# Patient Record
Sex: Female | Born: 1980 | Race: Black or African American | Hispanic: No | Marital: Married | Smoking: Never smoker
Health system: Southern US, Community
[De-identification: ages and names within clinical notes are randomized; demographics above are authoritative.]

## PROBLEM LIST (undated history)

## (undated) DIAGNOSIS — Z789 Other specified health status: Secondary | ICD-10-CM

## (undated) HISTORY — DX: Other specified health status: Z78.9

## (undated) HISTORY — PX: MYOMECTOMY: SHX85

---

## 2001-09-29 ENCOUNTER — Emergency Department (HOSPITAL_COMMUNITY): Admission: EM | Admit: 2001-09-29 | Discharge: 2001-09-29 | Payer: Self-pay | Admitting: Emergency Medicine

## 2002-01-17 ENCOUNTER — Emergency Department (HOSPITAL_COMMUNITY): Admission: EM | Admit: 2002-01-17 | Discharge: 2002-01-17 | Payer: Self-pay | Admitting: Emergency Medicine

## 2004-09-25 ENCOUNTER — Ambulatory Visit (HOSPITAL_COMMUNITY): Admission: RE | Admit: 2004-09-25 | Discharge: 2004-09-25 | Payer: Self-pay | Admitting: Obstetrics and Gynecology

## 2005-02-22 ENCOUNTER — Inpatient Hospital Stay (HOSPITAL_COMMUNITY): Admission: RE | Admit: 2005-02-22 | Discharge: 2005-02-24 | Payer: Self-pay | Admitting: Obstetrics and Gynecology

## 2005-02-22 ENCOUNTER — Encounter: Payer: Self-pay | Admitting: Obstetrics and Gynecology

## 2005-03-02 ENCOUNTER — Emergency Department (HOSPITAL_COMMUNITY): Admission: EM | Admit: 2005-03-02 | Discharge: 2005-03-03 | Payer: Self-pay | Admitting: *Deleted

## 2005-05-23 ENCOUNTER — Ambulatory Visit (HOSPITAL_COMMUNITY): Admission: RE | Admit: 2005-05-23 | Discharge: 2005-05-23 | Payer: Self-pay | Admitting: Obstetrics and Gynecology

## 2007-01-07 IMAGING — RF DG HYSTEROGRAM
6 series · 10 of 10 positions shown · IV contrast (omnipaque)
Comparison: none

CLINICAL DATA: Infertility in patient status post myomectomy.
 HYSTEROSALPINGOGRAM:
TECHNIQUE: Following cleansing of the cervix and vagina with Betadine, a hysterosalpingogram catheter was placed into the endocervical canal.  Hysterosalpingogram was then performed using Omnipaque 300 contrast.

[Series 1: run · 3 of 3 slices shown (1 of 6)]
[im 1/3]
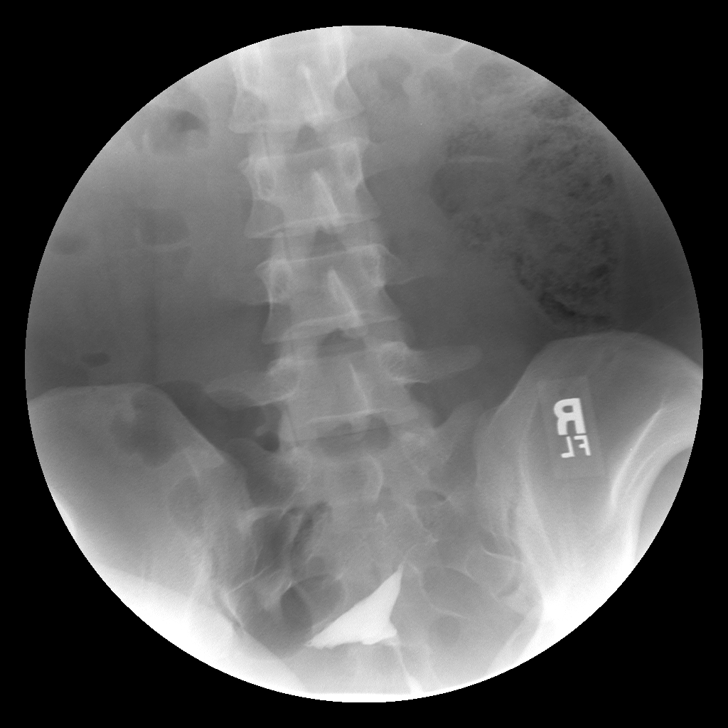
[im 2/3]
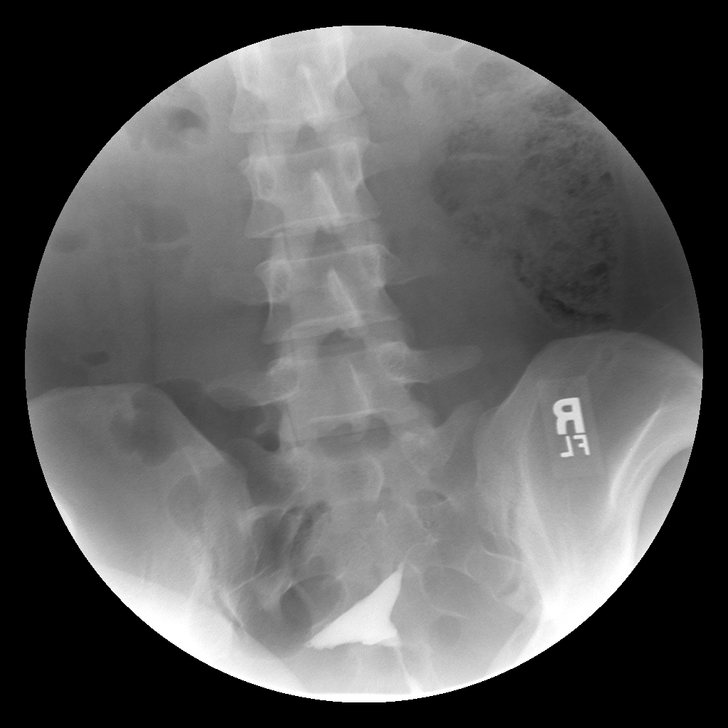
[im 3/3]
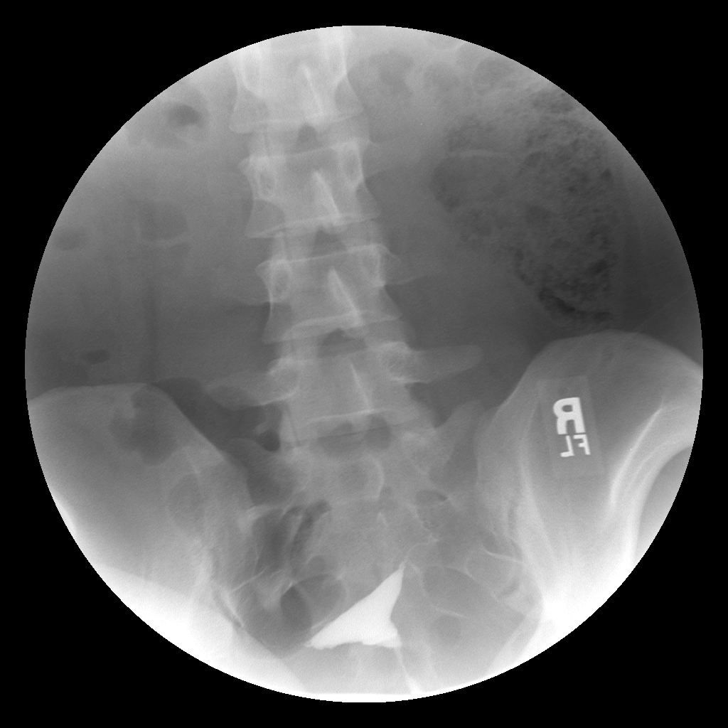

[Series 2: run · 2 of 2 slices shown (2 of 6)]
[im 1/2]
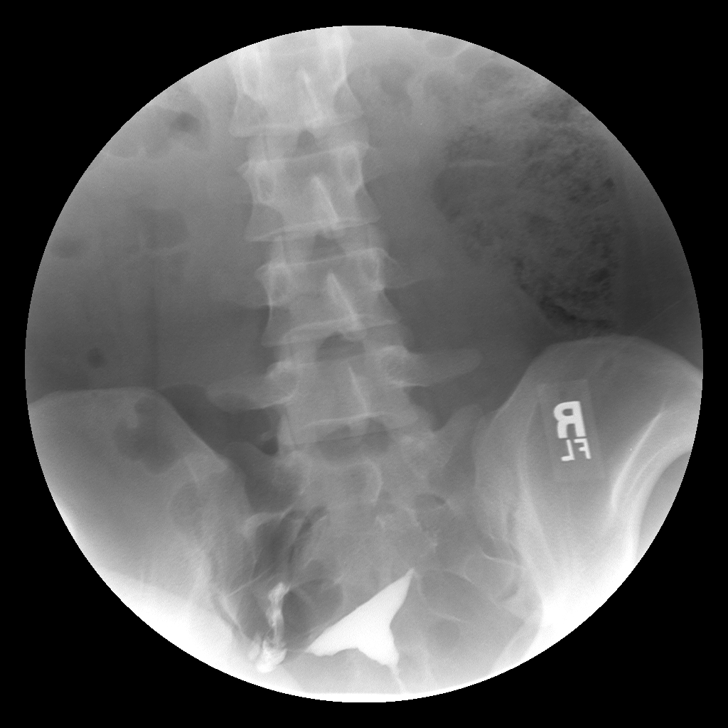
[im 2/2]
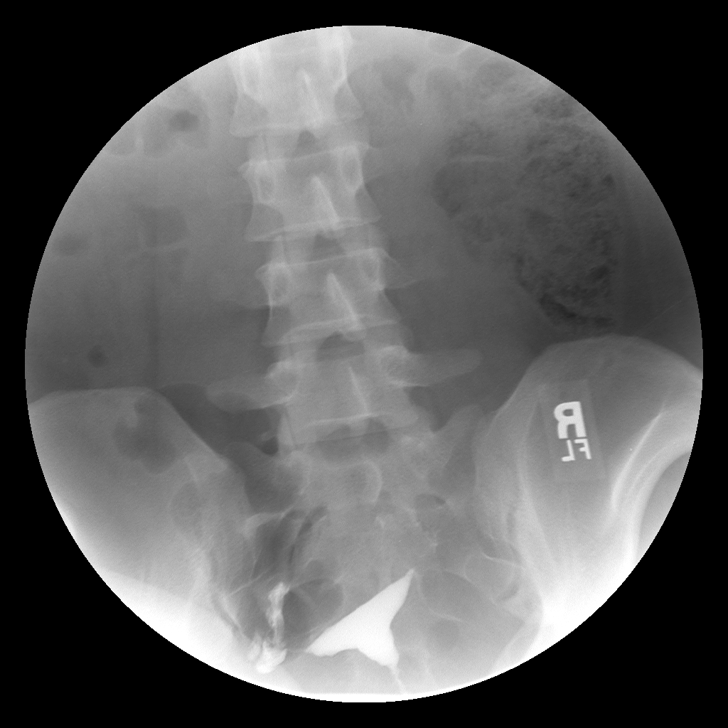

[Series 3: run · 2 of 2 slices shown (3 of 6)]
[im 1/2]
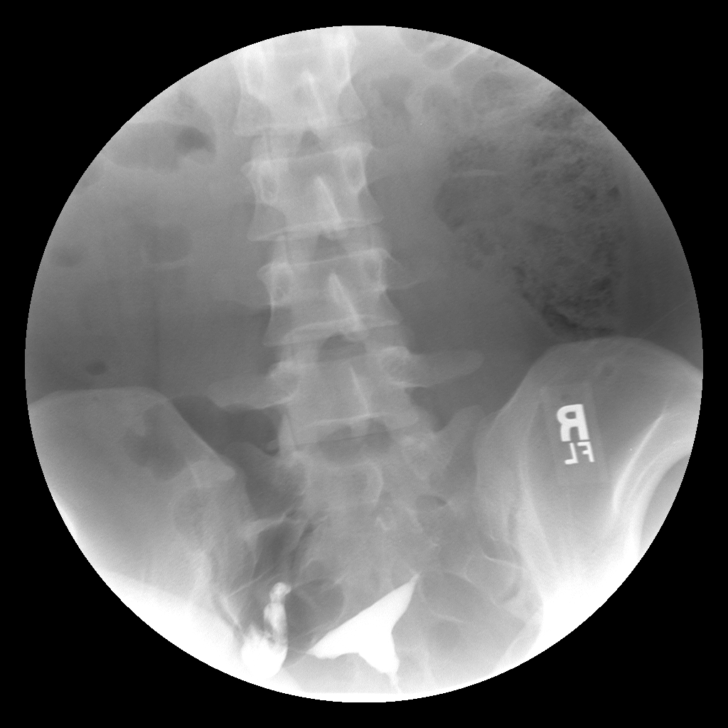
[im 2/2]
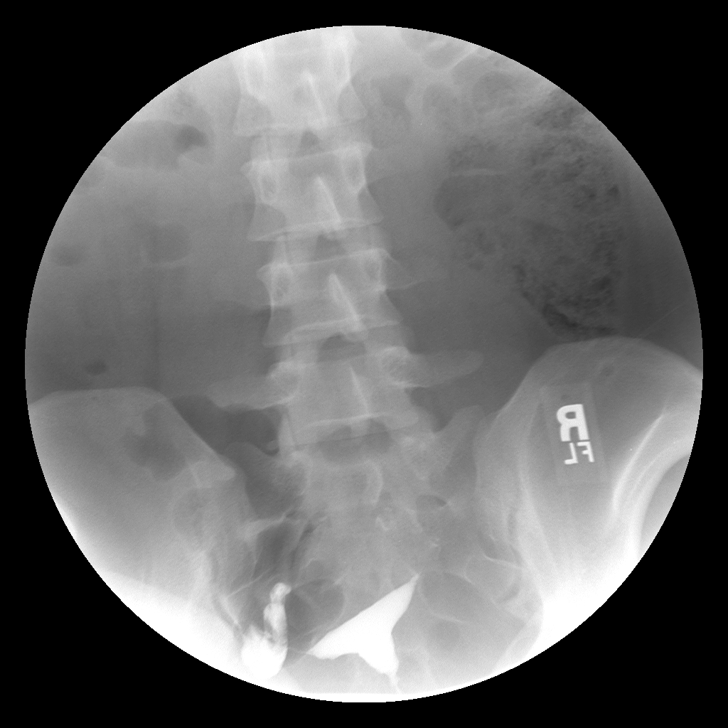

[Series 4: run · 1 of 1 slices shown (4 of 6)]
[im 1/1]
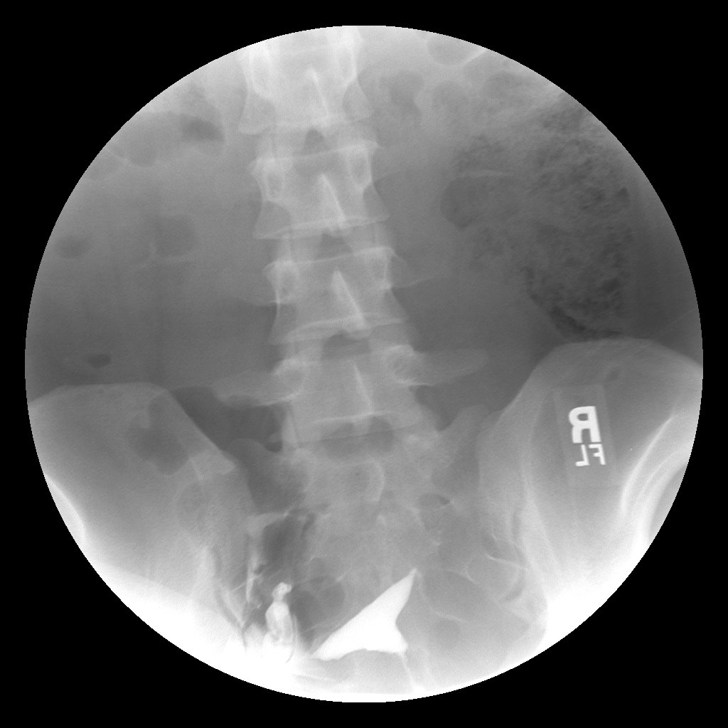

[Series 5: run · 1 of 1 slices shown (5 of 6)]
[im 1/1]
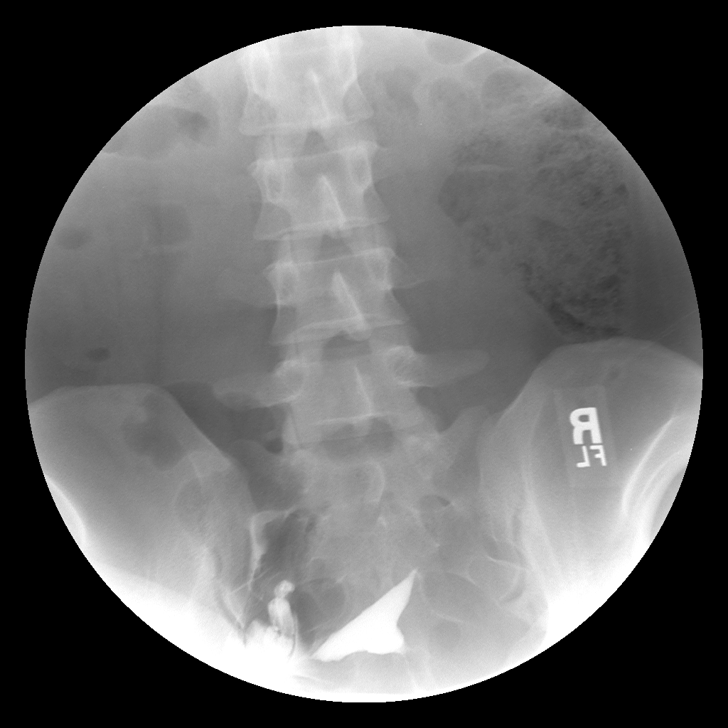

[Series 6: run · 1 of 1 slices shown (6 of 6)]
[im 1/1]
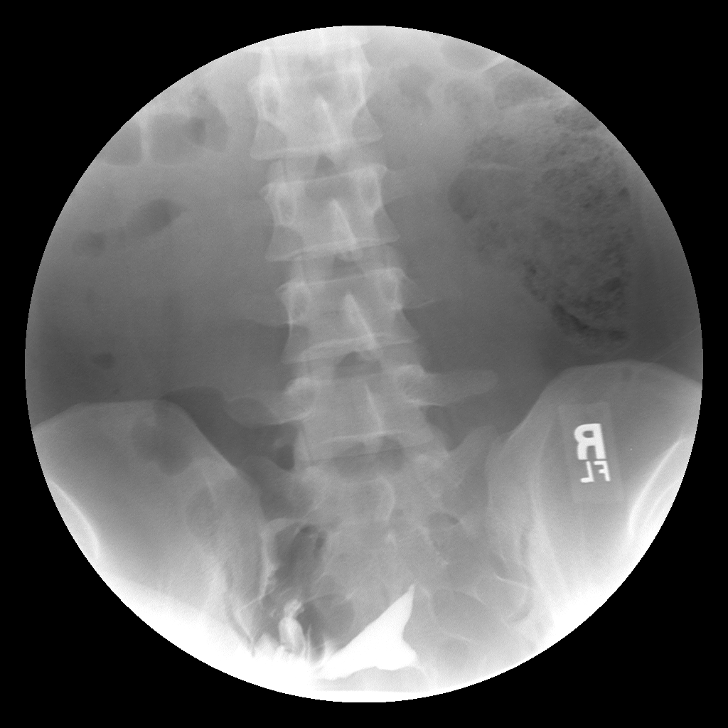

[10 of 10 positions shown; findings below may reference images not displayed]

FINDINGS: The uterus has a normal configuration.  There is prompt spill of contrast material out of the left Fallopian tube.  The right tube fills with contrast and measures approximately 1.5 cm.  No spill of contrast is identified from the right tube.  Note is made that the cervical canal appears somewhat narrow.
IMPRESSION: 1.  Normal appearing left Fallopian tube with spill of contrast material into the peritoneal cavity.
 2.  Right Fallopian tube fills with contrast but there is no spill of contrast material into the peritoneal cavity.

## 2007-07-11 ENCOUNTER — Ambulatory Visit: Payer: Self-pay | Admitting: Family Medicine

## 2007-07-11 DIAGNOSIS — O3410 Maternal care for benign tumor of corpus uteri, unspecified trimester: Secondary | ICD-10-CM

## 2007-07-11 DIAGNOSIS — D259 Leiomyoma of uterus, unspecified: Secondary | ICD-10-CM | POA: Insufficient documentation

## 2007-07-11 DIAGNOSIS — E663 Overweight: Secondary | ICD-10-CM | POA: Insufficient documentation

## 2007-07-11 DIAGNOSIS — R42 Dizziness and giddiness: Secondary | ICD-10-CM | POA: Insufficient documentation

## 2007-07-14 ENCOUNTER — Telehealth (INDEPENDENT_AMBULATORY_CARE_PROVIDER_SITE_OTHER): Payer: Self-pay | Admitting: *Deleted

## 2007-07-14 LAB — CONVERTED CEMR LAB
Basophils Absolute: 0 10*3/uL (ref 0.0–0.1)
CO2: 20 meq/L (ref 19–32)
Calcium: 9.1 mg/dL (ref 8.4–10.5)
Chloride: 105 meq/L (ref 96–112)
Creatinine, Ser: 0.65 mg/dL (ref 0.40–1.20)
Glucose, Bld: 90 mg/dL (ref 70–99)
Hemoglobin: 13.1 g/dL (ref 12.0–15.0)
MCHC: 34.8 g/dL (ref 30.0–36.0)
MCV: 89.3 fL (ref 78.0–100.0)
RBC: 4.21 M/uL (ref 3.87–5.11)
Sodium: 138 meq/L (ref 135–145)
TSH: 1.183 microintl units/mL (ref 0.350–5.50)
Total Bilirubin: 0.3 mg/dL (ref 0.3–1.2)
WBC: 5.3 10*3/uL (ref 4.0–10.5)

## 2007-07-15 ENCOUNTER — Encounter (INDEPENDENT_AMBULATORY_CARE_PROVIDER_SITE_OTHER): Payer: Self-pay | Admitting: Family Medicine

## 2007-07-25 ENCOUNTER — Ambulatory Visit: Payer: Self-pay | Admitting: Family Medicine

## 2007-07-25 LAB — CONVERTED CEMR LAB
Bilirubin Urine: NEGATIVE
Blood in Urine, dipstick: NEGATIVE
Glucose, Urine, Semiquant: NEGATIVE
Protein, U semiquant: NEGATIVE
Specific Gravity, Urine: 1.02
Urobilinogen, UA: 0.2
WBC Urine, dipstick: NEGATIVE

## 2007-07-26 ENCOUNTER — Encounter (INDEPENDENT_AMBULATORY_CARE_PROVIDER_SITE_OTHER): Payer: Self-pay | Admitting: Family Medicine

## 2007-07-27 LAB — CONVERTED CEMR LAB
Cholesterol: 144 mg/dL (ref 0–200)
VLDL: 10 mg/dL (ref 0–40)

## 2007-07-28 ENCOUNTER — Telehealth (INDEPENDENT_AMBULATORY_CARE_PROVIDER_SITE_OTHER): Payer: Self-pay | Admitting: *Deleted

## 2007-07-29 ENCOUNTER — Encounter (INDEPENDENT_AMBULATORY_CARE_PROVIDER_SITE_OTHER): Payer: Self-pay | Admitting: Family Medicine

## 2007-09-11 ENCOUNTER — Ambulatory Visit: Payer: Self-pay | Admitting: Family Medicine

## 2007-09-11 DIAGNOSIS — R51 Headache: Secondary | ICD-10-CM | POA: Insufficient documentation

## 2007-09-11 DIAGNOSIS — R519 Headache, unspecified: Secondary | ICD-10-CM | POA: Insufficient documentation

## 2008-03-09 LAB — CONVERTED CEMR LAB

## 2008-03-17 ENCOUNTER — Other Ambulatory Visit: Admission: RE | Admit: 2008-03-17 | Discharge: 2008-03-17 | Payer: Self-pay | Admitting: Obstetrics and Gynecology

## 2008-03-26 ENCOUNTER — Ambulatory Visit (HOSPITAL_COMMUNITY): Admission: RE | Admit: 2008-03-26 | Discharge: 2008-03-26 | Payer: Self-pay | Admitting: Obstetrics and Gynecology

## 2008-09-22 ENCOUNTER — Ambulatory Visit: Payer: Self-pay | Admitting: Family Medicine

## 2008-09-22 DIAGNOSIS — R5383 Other fatigue: Secondary | ICD-10-CM

## 2008-09-22 DIAGNOSIS — F4321 Adjustment disorder with depressed mood: Secondary | ICD-10-CM | POA: Insufficient documentation

## 2008-09-22 DIAGNOSIS — R5381 Other malaise: Secondary | ICD-10-CM | POA: Insufficient documentation

## 2008-09-23 ENCOUNTER — Encounter (INDEPENDENT_AMBULATORY_CARE_PROVIDER_SITE_OTHER): Payer: Self-pay | Admitting: Family Medicine

## 2008-09-24 ENCOUNTER — Encounter (INDEPENDENT_AMBULATORY_CARE_PROVIDER_SITE_OTHER): Payer: Self-pay | Admitting: *Deleted

## 2008-09-24 LAB — CONVERTED CEMR LAB
Basophils Absolute: 0 10*3/uL (ref 0.0–0.1)
Eosinophils Relative: 0 % (ref 0–5)
HCT: 38.5 % (ref 36.0–46.0)
Lymphocytes Relative: 39 % (ref 12–46)
MCV: 89.5 fL (ref 78.0–100.0)
Monocytes Absolute: 0.4 10*3/uL (ref 0.1–1.0)
Neutrophils Relative %: 51 % (ref 43–77)
Platelets: 249 10*3/uL (ref 150–400)
RBC: 4.3 M/uL (ref 3.87–5.11)

## 2008-10-14 ENCOUNTER — Ambulatory Visit (HOSPITAL_COMMUNITY): Payer: Self-pay | Admitting: Psychiatry

## 2008-10-21 ENCOUNTER — Ambulatory Visit (HOSPITAL_COMMUNITY): Payer: Self-pay | Admitting: Psychiatry

## 2008-11-26 ENCOUNTER — Emergency Department (HOSPITAL_BASED_OUTPATIENT_CLINIC_OR_DEPARTMENT_OTHER): Admission: EM | Admit: 2008-11-26 | Discharge: 2008-11-26 | Payer: Self-pay | Admitting: Emergency Medicine

## 2009-09-16 ENCOUNTER — Other Ambulatory Visit: Admission: RE | Admit: 2009-09-16 | Discharge: 2009-09-16 | Payer: Self-pay | Admitting: Obstetrics and Gynecology

## 2010-07-15 LAB — PREGNANCY, URINE: Preg Test, Ur: NEGATIVE

## 2010-07-15 LAB — GLUCOSE, CAPILLARY: Glucose-Capillary: 104 mg/dL — ABNORMAL HIGH (ref 70–99)

## 2010-08-25 NOTE — H&P (Signed)
Julie Robbins, Julie NO.:  [id number]   MEDICAL RECORD NO.:  [id number]          PATIENT TYPE:  AMB   LOCATION:  DAY                           FACILITY:  [facility]   PHYSICIAN:  Tilda Burrow, M.D. DATE OF BIRTH:  15-Aug-1980   DATE OF ADMISSION:  02/22/2005  DATE OF DISCHARGE:  LH                                HISTORY & PHYSICAL   ADMISSION DIAGNOSES:  1.  Uterine fibroids, 20-week size.  2.  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  3.  Status post Lupron suppression of menses.   HISTORY OF PRESENT ILLNESS:  This 30 year old female, gravida 0, para 0, now  3 months status post taking Lupron injection is admitted at this time for  multiple myomectomy.  Julie Robbins has been seen in our office over the past few  month, had ultrasound performed at [facility] showing large fibroid  deformity of the uterus with fibroids up to 13 cm in diameter, 2 additional  masses measuring 6 x 6 cm and 6 x 9 x 5.7 cm.  The endometrial stripe could  be followed through the uterine mass.  She was then placed on Lupron  suppression of the endometrium.  She is now at this time scheduled for  multiple myomectomy.  The patient is aware that the potential for bleeding  or technical complications could result in tubal obstruction after the  procedure.  Hysterectomy would only be considered if massive bleeding were  encountered.  She has been given the option of referral to Dr. Elesa Hacker for  surgery, and she has declines this option.   PAST MEDICAL HISTORY:  Essentially benign.   PAST SURGICAL HISTORY:  Negative.   INJURIES:  None.   PHYSICAL EXAMINATION:  VITAL SIGNS:  Height 5 feet 4 inches, weight 150s.  Blood pressure 124/70.  GENERAL:  Exam shows a relatively slim, intelligent, active, well-spoken  African-American female, alert and oriented x3.  HEENT:  Pupils equal, round, and reactive to light.  Extraocular movements  intact.  NECK:  Supple.  CHEST:  Clear to  auscultation.  ABDOMEN:  Hard, firm mass to the umbilicus due to the fibroid enlargement of  the uterus.  Midline vertical incision will be required.  PELVIC:  Cervix is deviated very posteriorly and to the right by large left-  sided fibroid.  The uterus is impacted into  the symphysis pubis.  EXTREMITIES:  Grossly normal without cyanosis, clubbing, or edema.   PLAN:  Multiple myomectomies on February 22, 2005.      Tilda Burrow, M.D.  Electronically Signed     JVF/MEDQ  D:  02/18/2005  T:  02/18/2005  Job:  [id number]

## 2010-08-25 NOTE — Op Note (Signed)
[name], [name] NO.:  [id number]   MEDICAL RECORD NO.:  [id number]          PATIENT TYPE:  INP   LOCATION:  A418                          FACILITY:  [facility]   PHYSICIAN:  Tilda Burrow, M.D. DATE OF BIRTH:  Apr 15, 1980   DATE OF PROCEDURE:  DATE OF DISCHARGE:  02/24/2005                                 OPERATIVE REPORT   l   PREOPERATIVE DIAGNOSIS:  Multiple uterine fibroids.   POSTOPERATIVE DIAGNOSIS:  Multiple uterine fibroids.  Same.   PROCEDURE:  Multiple myomectomies.   SURGEON:  Tilda Burrow, M.D.   ASSISTANT:  1.  Kendrick, CST  2.  Amie Critchley, CST   ANESTHESIA:  Reuel Boom, CRNA   COMPLICATIONS:  None.   FINDINGS:  Huge myomas, three of which were excised.  A single, small 1 cm  myoma was left in place on the posterior uterine fundus She had showed  myomas, C3, for three of which were excised to a single small 1 cm myoma was  left in place on the posterior uterine fundus, subserosal felt not to be  clinically significant.   DESCRIPTION OF PROCEDURE:  The patient was taken to the operating room,  prepped and draped in usual fashion for lower abdominal surgery.  A midline  vertical incision was performed from the umbilicus to the mons pubis with  easy entry into the peritoneal cavity and through the midline, packing the  bowel away.  A Balfour retractor was not necessary.  The uterus flipped  anteriorly as documented in photo #1.  This showed the large myomas which  photo #2 shows from the foot of the table a transverse incision over the  uterus exposing a large myoma from the anterior lower uterine segment.  This  was measured at 10 x 6.5 cm in largest diameter once it was excised.  The  specimen was shelled out with a combination of sharp and blunt dissection  and was pulled out without any difficulty.  We did not see any evidence that  we entered the uterine cavity though the myometrium was quite thick  overlying the myoma. As we were  unable to perform preprocedure staining of  the endometrial cavity with methylene blue, due to the difficult access to  the cervix due to the deformity of the uterus caused fibroids.  The large  cavity was pulled together with a series of 2-0 Dexon interrupted sutures  with careful subserosal closure of the initial incision, photo #3.  Subsequently, we inspected the posterior aspects of the uterus as in photo  #4 where there was a large 5 cm fibroid posterior to the tube and ovary on  the right. There was a smaller one that rested underneath the left tube.  We  then took out the right posterior fibroid by making a vertical incision over  the myoma in such a way as to operate at least 2 cm from the tube and ovary  complex.  We were quite some distance from the fimbriae on the right side.  This myoma was shelled out very nicely as well with minimal cautery  necessary in the bed  for hemostasis.  We were well away from the tube and  ovary and no suspected occlusion of the tube was felt to be likely.  The  defect could be closed nicely.  Photos 5 and 6 showed preparation for this  right posterior myomectomy.  Subsequently the third and final myoma was  removed from beneath the left fallopian tube. This was accessed throughout  again. A vertical incision performed just anterior to the round ligament on  the left side. This was shelled out nicely as well and all three of them  closed easily without any you visible suggestion that we might be kinking or  compromising the tubes in any way.  Closure was with several layers in each  case but resulted in subcuticular closure with minimal asymmetry of the  incision edges. The uterus was irrigated and confirmed as hemostatic.  It  was reintroduced into the abdominal cavity, then irrigated once again  confirmed as hemostatic.  Anterior peritoneum closed with 2-0 chromic  followed by running 0 Prolene closure in the and subcuticular 2-0 plain  interrupted  closure of the subcu fatty tissue with a staple closure of the  skin  EBL was minimal.  Urine output was 500 cc.      Tilda Burrow, M.D.  Electronically Signed     JVF/MEDQ  D:  03/12/2005  T:  03/13/2005  Job:  [id number]

## 2010-08-25 NOTE — Discharge Summary (Signed)
NAMEKERRIE, Robbins NO.:  [id number]   MEDICAL RECORD NO.:  [id number]          PATIENT TYPE:  INP   LOCATION:  A418                          FACILITY:  [facility]   PHYSICIAN:  Tilda Burrow, M.D. DATE OF BIRTH:  [birth year]   DATE OF ADMISSION:  02/22/2005  DATE OF DISCHARGE:  11/18/2006LH                                 DISCHARGE SUMMARY   ADMISSION DIAGNOSIS:  Multiple uterine fibroids with primary infertility.   DISCHARGE DIAGNOSIS:  Multiple uterine fibroids with primary infertility.   PROCEDURE:  Multiple myomectomy x3 by Tilda Burrow, M.D.   DISCHARGE MEDICATIONS:  1.  Vicodin one to two q.4h. p.r.n. pain.  2.  Phenergan 25 mg one p.o. q.6h. p.r.n. nausea.  3.  Doxycycline 100 mg b.i.d. x7 days.   FOLLOW UP:  Follow up in 1 week in our office.   HOSPITAL COURSE:  This 30 year old, nulliparous female with huge fibroids  almost to the umbilicus was admitted for multiple myomectomy as described in  the admitting history.  A midline vertical incision was used to access the  20-week size uterus which had three fibroids removed with a total weight of  564 g with fibroids measuring from 294 g to almost 300 g.  The patient had  benign leiomyomas diagnosed on pathology.   Postoperative course was uneventful.  The patient had 100 mL blood loss.  She did not require blood.  Blood type is O positive.  Postop hemoglobin  10.9, hematocrit 30.7 compared to 11.3 and 32.9 on preoperative assessment.   CONDITION ON DISCHARGE:  Stable.   DISPOSITION:  Discharged with routine post surgical instructions on February 24, 2005.      Tilda Burrow, M.D.  Electronically Signed     JVF/MEDQ  D:  03/12/2005  T:  03/12/2005  Job:  [id number]

## 2010-12-25 ENCOUNTER — Other Ambulatory Visit: Payer: Self-pay | Admitting: Obstetrics and Gynecology

## 2010-12-25 DIAGNOSIS — D259 Leiomyoma of uterus, unspecified: Secondary | ICD-10-CM

## 2011-01-03 ENCOUNTER — Ambulatory Visit (HOSPITAL_COMMUNITY)
Admission: RE | Admit: 2011-01-03 | Discharge: 2011-01-03 | Disposition: A | Discharge status: Home / Self Care | Payer: 59 | Source: Ambulatory Visit | Attending: Obstetrics and Gynecology | Admitting: Obstetrics and Gynecology

## 2011-01-03 DIAGNOSIS — D259 Leiomyoma of uterus, unspecified: Secondary | ICD-10-CM | POA: Insufficient documentation

## 2011-01-03 MED ORDER — IOHEXOL 350 MG/ML SOLN
8.0000 mL | Freq: Once | INTRAVENOUS | Status: AC | PRN
  Administered 2011-01-03: 8 mL

## 2012-08-19 IMAGING — RF DG HYSTEROGRAM
4 series · 4 of 4 positions shown · non-contrast
Comparison: 05/23/2005

CLINICAL DATA: Previous myomectomy for uterine fibroids.  Evaluate
tubal patency.

HYSTEROSALPINGOGRAM
TECHNIQUE: Hysterosalpingogram was performed by the ordering
physician under fluoroscopy.  Fluoroscopic images are submitted for
interpretation following the procedure.
Fluoroscopy Time:  1.0 minutes.

[Series 1: run · 1 of 1 slices shown (1 of 4)]
[im 1/1]
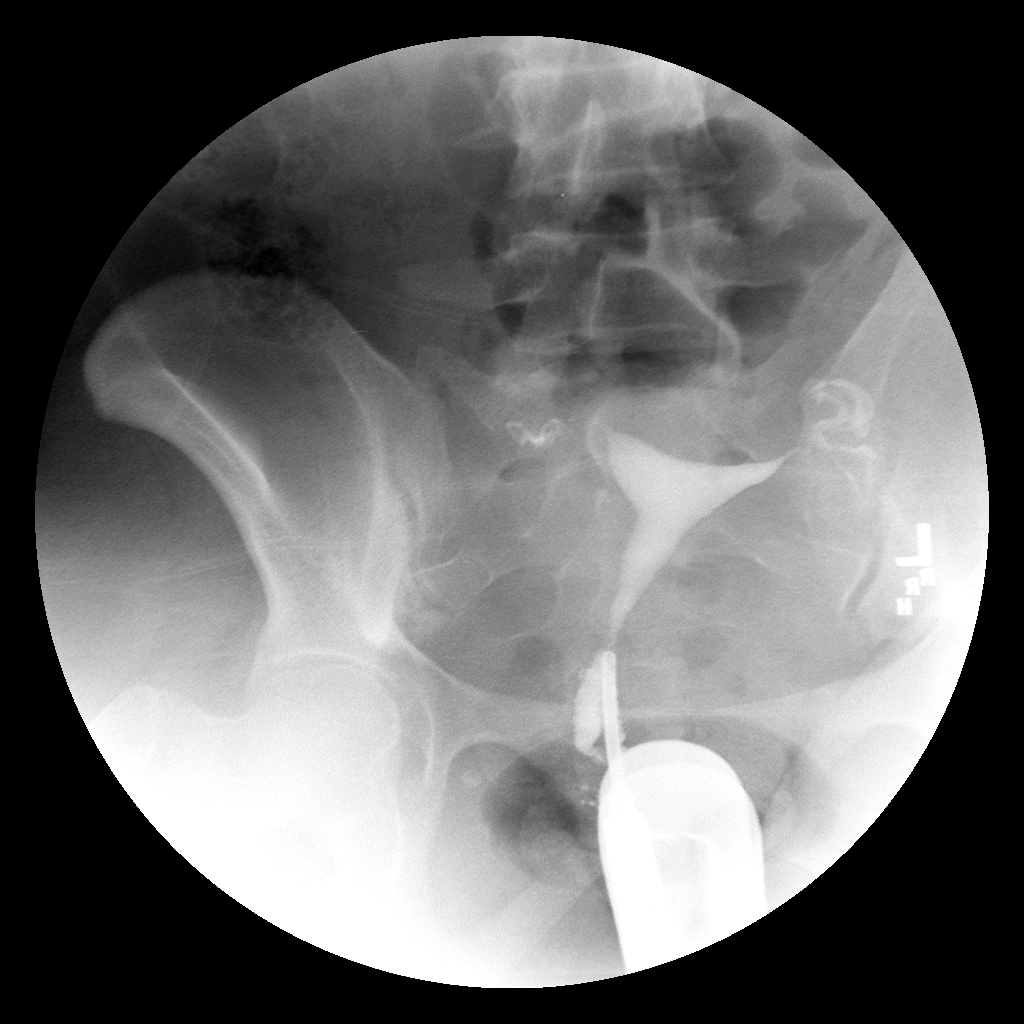

[Series 2: run · 1 of 1 slices shown (2 of 4)]
[im 1/1]
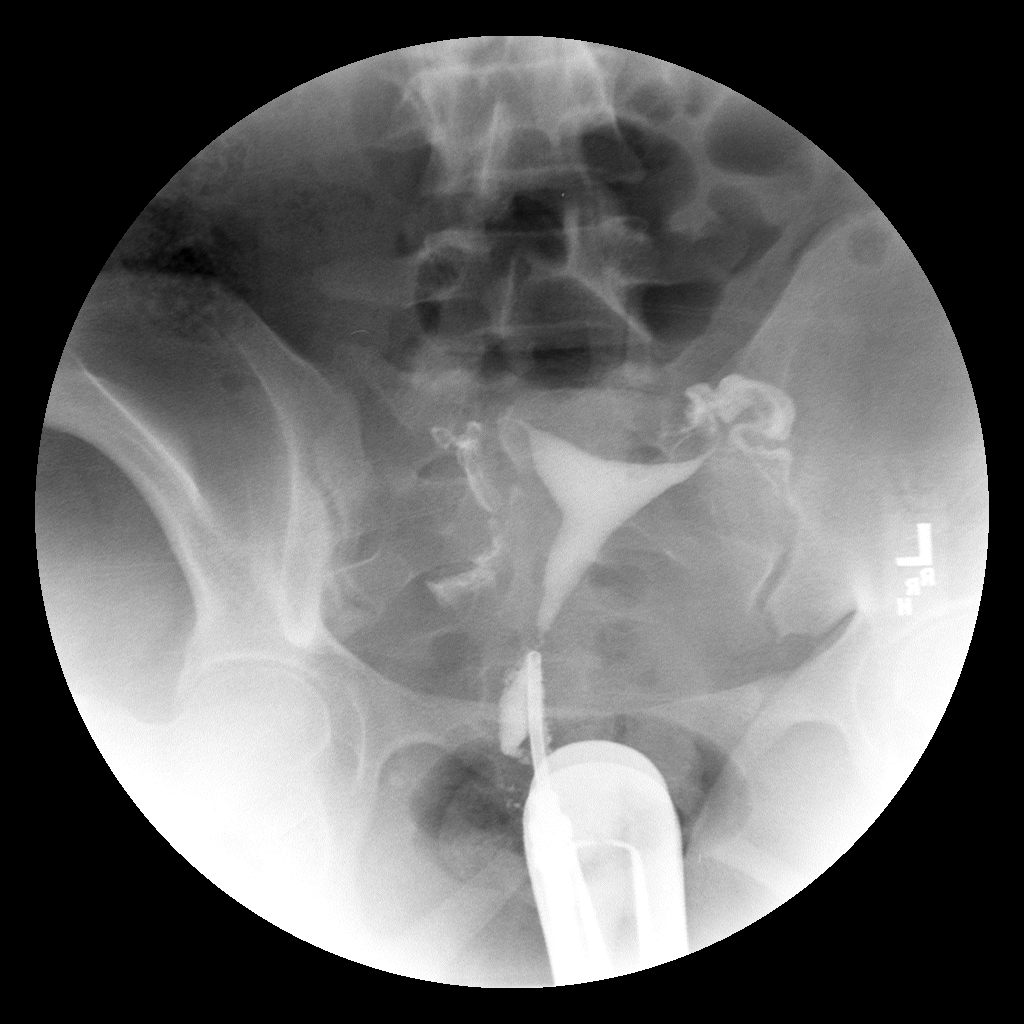

[Series 3: run · 1 of 1 slices shown (3 of 4)]
[im 1/1]
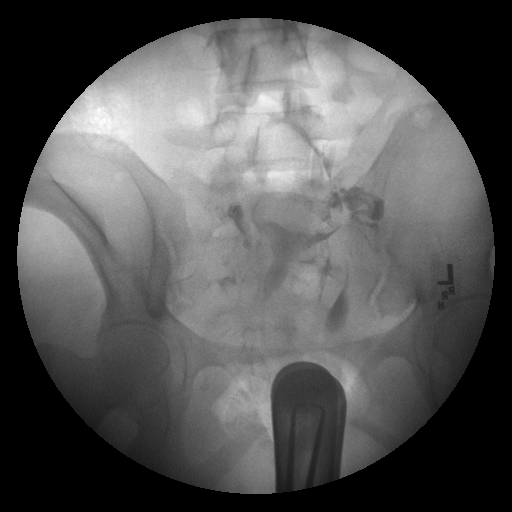

[Series 4: run · 1 of 1 slices shown (4 of 4)]
[im 1/1]
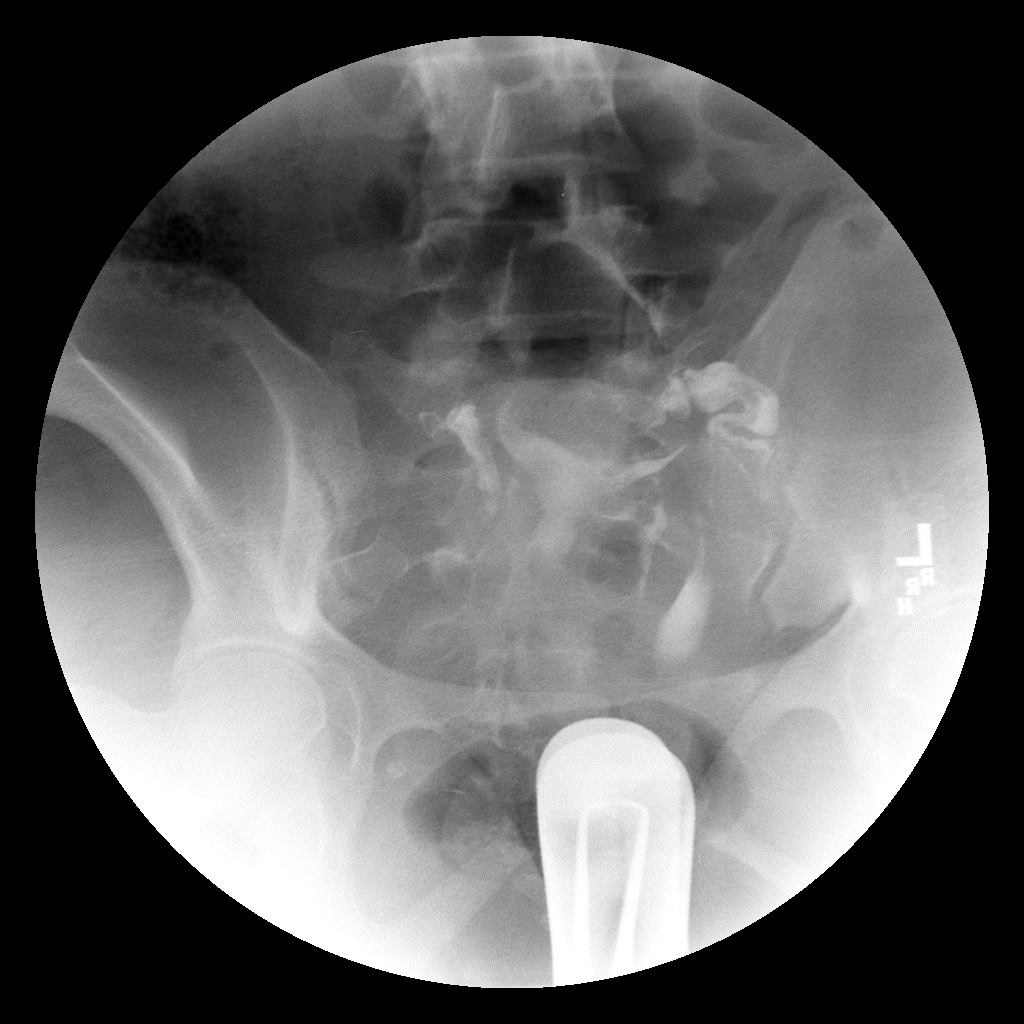

[4 of 4 positions shown; findings below may reference images not displayed]

FINDINGS: The endometrial cavity of the uterus is unremarkable in
appearance.

Contrast opacification of both fallopian tubes is seen.  Neither
fallopian tube is dilated.  Some intraperitoneal spill of contrast
from both fallopian tubes is demonstrated.  Some of the
intraperitoneal contrast appears partially loculated, and peritubal
adhesions cannot be excluded.
IMPRESSION: 1.  Both fallopian tubes are patent.  Partially loculated
appearance of intraperitoneal contrast raises suspicion for
peritubal adhesions.
2.  Unremarkable appearance of endometrial cavity.

## 2013-07-03 ENCOUNTER — Other Ambulatory Visit: Payer: Self-pay | Admitting: Obstetrics & Gynecology

## 2013-07-03 DIAGNOSIS — O3680X0 Pregnancy with inconclusive fetal viability, not applicable or unspecified: Secondary | ICD-10-CM

## 2013-07-06 ENCOUNTER — Other Ambulatory Visit: Payer: Self-pay | Admitting: Obstetrics & Gynecology

## 2013-07-06 ENCOUNTER — Encounter (INDEPENDENT_AMBULATORY_CARE_PROVIDER_SITE_OTHER): Payer: Self-pay

## 2013-07-06 ENCOUNTER — Ambulatory Visit (INDEPENDENT_AMBULATORY_CARE_PROVIDER_SITE_OTHER): Payer: BC Managed Care – PPO

## 2013-07-06 DIAGNOSIS — O3410 Maternal care for benign tumor of corpus uteri, unspecified trimester: Secondary | ICD-10-CM

## 2013-07-06 DIAGNOSIS — O3680X0 Pregnancy with inconclusive fetal viability, not applicable or unspecified: Secondary | ICD-10-CM

## 2013-07-06 NOTE — Progress Notes (Signed)
U/S(7+4wks)-single IUP with +FCA noted, FHR-170 BPM, CRL c/w LMP dates, cx appears closed, bilateral adnexa appears WNL, multiple fibroids noted within uterus largest 2 =46.64mm & 34.13mm all the rest are 2cm or less

## 2013-07-15 ENCOUNTER — Encounter: Payer: Self-pay | Admitting: Women's Health

## 2013-07-15 ENCOUNTER — Other Ambulatory Visit (HOSPITAL_COMMUNITY)
Admission: RE | Admit: 2013-07-15 | Discharge: 2013-07-15 | Disposition: A | Discharge status: Home / Self Care | Payer: BC Managed Care – PPO | Source: Ambulatory Visit | Attending: Obstetrics & Gynecology | Admitting: Obstetrics & Gynecology

## 2013-07-15 ENCOUNTER — Encounter: Payer: BC Managed Care – PPO | Admitting: Women's Health

## 2013-07-15 ENCOUNTER — Ambulatory Visit (INDEPENDENT_AMBULATORY_CARE_PROVIDER_SITE_OTHER): Payer: BC Managed Care – PPO | Admitting: Women's Health

## 2013-07-15 VITALS — BP 132/72 | Wt 168.5 lb

## 2013-07-15 DIAGNOSIS — D259 Leiomyoma of uterus, unspecified: Secondary | ICD-10-CM

## 2013-07-15 DIAGNOSIS — Z1151 Encounter for screening for human papillomavirus (HPV): Secondary | ICD-10-CM | POA: Insufficient documentation

## 2013-07-15 DIAGNOSIS — Z9889 Other specified postprocedural states: Secondary | ICD-10-CM | POA: Insufficient documentation

## 2013-07-15 DIAGNOSIS — Z331 Pregnant state, incidental: Secondary | ICD-10-CM

## 2013-07-15 DIAGNOSIS — O99340 Other mental disorders complicating pregnancy, unspecified trimester: Secondary | ICD-10-CM

## 2013-07-15 DIAGNOSIS — O3410 Maternal care for benign tumor of corpus uteri, unspecified trimester: Secondary | ICD-10-CM

## 2013-07-15 DIAGNOSIS — Z1389 Encounter for screening for other disorder: Secondary | ICD-10-CM

## 2013-07-15 DIAGNOSIS — Z113 Encounter for screening for infections with a predominantly sexual mode of transmission: Secondary | ICD-10-CM | POA: Insufficient documentation

## 2013-07-15 DIAGNOSIS — Z01419 Encounter for gynecological examination (general) (routine) without abnormal findings: Secondary | ICD-10-CM | POA: Insufficient documentation

## 2013-07-15 DIAGNOSIS — Z3400 Encounter for supervision of normal first pregnancy, unspecified trimester: Secondary | ICD-10-CM | POA: Insufficient documentation

## 2013-07-15 LAB — POCT URINALYSIS DIPSTICK
Blood, UA: NEGATIVE
Glucose, UA: NEGATIVE
KETONES UA: NEGATIVE
Leukocytes, UA: NEGATIVE
Nitrite, UA: NEGATIVE
Protein, UA: NEGATIVE

## 2013-07-15 LAB — CBC
HCT: 36.3 % (ref 36.0–46.0)
Hemoglobin: 12.9 g/dL (ref 12.0–15.0)
MCH: 31.2 pg (ref 26.0–34.0)
MCHC: 35.5 g/dL (ref 30.0–36.0)
MCV: 87.9 fL (ref 78.0–100.0)
PLATELETS: 265 10*3/uL (ref 150–400)
RBC: 4.13 MIL/uL (ref 3.87–5.11)
RDW: 13.5 % (ref 11.5–15.5)
WBC: 6.8 10*3/uL (ref 4.0–10.5)

## 2013-07-15 NOTE — Patient Instructions (Signed)
Nausea & Vomiting  Have saltine crackers or pretzels by your bed and eat a few bites before you raise your head out of bed in the morning  Eat small frequent meals throughout the day instead of large meals  Drink plenty of fluids throughout the day to stay hydrated, just don't drink a lot of fluids with your meals.  This can make your stomach fill up faster making you feel sick  Do not brush your teeth right after you eat  Products with real ginger are good for nausea, like ginger ale and ginger hard candy Make sure it says made with real ginger!  Sucking on sour candy like lemon heads is also good for nausea  If your prenatal vitamins make you nauseated, take them at night so you will sleep through the nausea  If you feel like you need medicine for the nausea & vomiting please let us know  If you are unable to keep any fluids or food down please let us know    Pregnancy - First Trimester During sexual intercourse, millions of sperm go into the vagina. Only 1 sperm will penetrate and fertilize the female egg while it is in the Fallopian tube. One week later, the fertilized egg implants into the wall of the uterus. An embryo begins to develop into a baby. At 6 to 8 weeks, the eyes and face are formed and the heartbeat can be seen on ultrasound. At the end of 12 weeks (first trimester), all the baby's organs are formed. Now that you are pregnant, you will want to do everything you can to have a healthy baby. Two of the most important things are to get good prenatal care and follow your caregiver's instructions. Prenatal care is all the medical care you receive before the baby's birth. It is given to prevent, find, and treat problems during the pregnancy and childbirth. PRENATAL EXAMS  During prenatal visits, your weight, blood pressure, and urine are checked. This is done to make sure you are healthy and progressing normally during the pregnancy.  A pregnant woman should gain 25 to 35 pounds  during the pregnancy. However, if you are overweight or underweight, your caregiver will advise you regarding your weight.  Your caregiver will ask and answer questions for you.  Blood work, cervical cultures, other necessary tests, and a Pap test are done during your prenatal exams. These tests are done to check on your health and the probable health of your baby. Tests are strongly recommended and done for HIV with your permission. This is the virus that causes AIDS. These tests are done because medicines can be given to help prevent your baby from being born with this infection should you have been infected without knowing it. Blood work is also used to find out your blood type, previous infections, and follow your blood levels (hemoglobin).  Low hemoglobin (anemia) is common during pregnancy. Iron and vitamins are given to help prevent this. Later in the pregnancy, blood tests for diabetes will be done along with any other tests if any problems develop.  You may need other tests to make sure you and the baby are doing well. CHANGES DURING THE FIRST TRIMESTER  Your body goes through many changes during pregnancy. They vary from person to person. Talk to your caregiver about changes you notice and are concerned about. Changes can include:  Your menstrual period stops.  The egg and sperm carry the genes that determine what you look like. Genes from you  and your partner are forming a baby. The female genes determine whether the baby is a boy or a girl.  Your body increases in girth and you may feel bloated.  Feeling sick to your stomach (nauseous) and throwing up (vomiting). If the vomiting is uncontrollable, call your caregiver.  Your breasts will begin to enlarge and become tender.  Your nipples may stick out more and become darker.  The need to urinate more. Painful urination may mean you have a bladder infection.  Tiring easily.  Loss of appetite.  Cravings for certain kinds of  food.  At first, you may gain or lose a couple of pounds.  You may have changes in your emotions from day to day (excited to be pregnant or concerned something may go wrong with the pregnancy and baby).  You may have more vivid and strange dreams. HOME CARE INSTRUCTIONS   It is very important to avoid all smoking, alcohol and non-prescribed drugs during your pregnancy. These affect the formation and growth of the baby. Avoid chemicals while pregnant to ensure the delivery of a healthy infant.  Start your prenatal visits by the 12th week of pregnancy. They are usually scheduled monthly at first, then more often in the last 2 months before delivery. Keep your caregiver's appointments. Follow your caregiver's instructions regarding medicine use, blood and lab tests, exercise, and diet.  During pregnancy, you are providing food for you and your baby. Eat regular, well-balanced meals. Choose foods such as meat, fish, milk and other low fat dairy products, vegetables, fruits, and whole-grain breads and cereals. Your caregiver will tell you of the ideal weight gain.  You can help morning sickness by keeping soda crackers at the bedside. Eat a couple before arising in the morning. You may want to use the crackers without salt on them.  Eating 4 to 5 small meals rather than 3 large meals a day also may help the nausea and vomiting.  Drinking liquids between meals instead of during meals also seems to help nausea and vomiting.  A physical sexual relationship may be continued throughout pregnancy if there are no other problems. Problems may be early (premature) leaking of amniotic fluid from the membranes, vaginal bleeding, or belly (abdominal) pain.  Exercise regularly if there are no restrictions. Check with your caregiver or physical therapist if you are unsure of the safety of some of your exercises. Greater weight gain will occur in the last 2 trimesters of pregnancy. Exercising will  help:  Control your weight.  Keep you in shape.  Prepare you for labor and delivery.  Help you lose your pregnancy weight after you deliver your baby.  Wear a good support or jogging bra for breast tenderness during pregnancy. This may help if worn during sleep too.  Ask when prenatal classes are available. Begin classes when they are offered.  Do not use hot tubs, steam rooms, or saunas.  Wear your seat belt when driving. This protects you and your baby if you are in an accident.  Avoid raw meat, uncooked cheese, cat litter boxes, and soil used by cats throughout the pregnancy. These carry germs that can cause birth defects in the baby.  The first trimester is a good time to visit your dentist for your dental health. Getting your teeth cleaned is okay. Use a softer toothbrush and brush gently during pregnancy.  Ask for help if you have financial, counseling, or nutritional needs during pregnancy. Your caregiver will be able to offer counseling for  these needs as well as refer you for other special needs.  Do not take any medicines or herbs unless told by your caregiver.  Inform your caregiver if there is any mental or physical domestic violence.  Make a list of emergency phone numbers of family, friends, hospital, and police and fire departments.  Write down your questions. Take them to your prenatal visit.  Do not douche.  Do not cross your legs.  If you have to stand for long periods of time, rotate you feet or take small steps in a circle.  You may have more vaginal secretions that may require a sanitary pad. Do not use tampons or scented sanitary pads. MEDICINES AND DRUG USE IN PREGNANCY  Take prenatal vitamins as directed. The vitamin should contain 1 milligram of folic acid. Keep all vitamins out of reach of children. Only a couple vitamins or tablets containing iron may be fatal to a baby or young child when ingested.  Avoid use of all medicines, including herbs,  over-the-counter medicines, not prescribed or suggested by your caregiver. Only take over-the-counter or prescription medicines for pain, discomfort, or fever as directed by your caregiver. Do not use aspirin, ibuprofen, or naproxen unless directed by your caregiver.  Let your caregiver also know about herbs you may be using.  Alcohol is related to a number of birth defects. This includes fetal alcohol syndrome. All alcohol, in any form, should be avoided completely. Smoking will cause low birth rate and premature babies.  Street or illegal drugs are very harmful to the baby. They are absolutely forbidden. A baby born to an addicted mother will be addicted at birth. The baby will go through the same withdrawal an adult does.  Let your caregiver know about any medicines that you have to take and for what reason you take them. SEEK MEDICAL CARE IF:  You have any concerns or worries during your pregnancy. It is better to call with your questions if you feel they cannot wait, rather than worry about them. SEEK IMMEDIATE MEDICAL CARE IF:   An unexplained oral temperature above 102 F (38.9 C) develops, or as your caregiver suggests.  You have leaking of fluid from the vagina (birth canal). If leaking membranes are suspected, take your temperature and inform your caregiver of this when you call.  There is vaginal spotting or bleeding. Notify your caregiver of the amount and how many pads are used.  You develop a bad smelling vaginal discharge with a change in the color.  You continue to feel sick to your stomach (nauseated) and have no relief from remedies suggested. You vomit blood or coffee ground-like materials.  You lose more than 2 pounds of weight in 1 week.  You gain more than 2 pounds of weight in 1 week and you notice swelling of your face, hands, feet, or legs.  You gain 5 pounds or more in 1 week (even if you do not have swelling of your hands, face, legs, or feet).  You get  exposed to [place] measles and have never had them.  You are exposed to fifth disease or chickenpox.  You develop belly (abdominal) pain. Round ligament discomfort is a common non-cancerous (benign) cause of abdominal pain in pregnancy. Your caregiver still must evaluate this.  You develop headache, fever, diarrhea, pain with urination, or shortness of breath.  You fall or are in a car accident or have any kind of trauma.  There is mental or physical violence in your home. Document  Released: 03/20/2001 Document Revised: 12/19/2011 Document Reviewed: 09/21/2008 [employer] Patient Information 2014 [employer].  

## 2013-07-15 NOTE — Progress Notes (Signed)
  Subjective:  Julie Robbins is a 33 y.o. G67P0 African American female at [redacted]w[redacted]d by LMP which correlates w/in 3d of 8wk u/s, being seen today for her first obstetrical visit.  Her obstetrical history is significant for h/o myomectomy for uterine fibroids x 3, 2 were open, 1 was lap. Primigravida.  Pregnancy history fully reviewed.  Patient reports occ nausea, not bad- declines meds. Denies vb, cramping, uti s/s, abnormal/malodorous vag d/c, or vulvovaginal itching/irritation.  BP 132/72  Wt 168 lb 8 oz (76.431 kg)  LMP 05/14/2013  HISTORY: OB History  Gravida Para Term Preterm AB SAB TAB Ectopic Multiple Living  1             # Outcome Date GA Lbr Len/2nd Weight Sex Delivery Anes PTL Lv  1 CUR              Past Medical History  Diagnosis Date  . Medical history non-contributory    Past Surgical History  Procedure Laterality Date  . Myomectomy  2006,2011,2014   Family History  Problem Relation Age of Onset  . Other Mother     degenerative joint disease  . Hypertension Maternal Grandmother   . Other Maternal Grandmother     degenerative joint disease  . Cancer Paternal Grandfather     multiple myloma    Exam   System:     General: Well developed & nourished, no acute distress   Skin: Warm & dry, normal coloration and turgor, no rashes   Neurologic: Alert & oriented, normal mood   Cardiovascular: Regular rate & rhythm   Respiratory: Effort & rate normal, LCTAB, acyanotic   Abdomen: Soft, non tender   Extremities: normal strength, tone   Pelvic Exam:    Perineum: Normal perineum   Vulva: Normal, no lesions   Vagina:  Normal mucosa, normal discharge   Cervix: Normal, bulbous, appears closed   Uterus: Normal size/shape/contour for GA   Thin prep pap smear obtained w/ reflex high risk HPV cotesting FHR: 178 via u/s   Assessment:   Pregnancy: G1P0 Patient Active Problem List   Diagnosis Date Noted  . Supervision of normal first pregnancy 07/15/2013   Priority: High  . Uterine fibroids in antepartum period 07/11/2007    Priority: High  . DEPRESSION, SITUATIONAL 09/22/2008  . MALAISE AND FATIGUE 09/22/2008  . HEADACHE 09/11/2007  . OVERWEIGHT 07/11/2007  . VERTIGO 07/11/2007    [redacted]w[redacted]d G1P0 New OB visit H/O myomectomy x 3 Multiple uterine fibroids    Plan:  Initial labs drawn Continue prenatal vitamins Problem list reviewed and updated Reviewed n/v relief measures and warning s/s to report Reviewed recommended weight gain based on pre-gravid BMI Encouraged well-balanced diet Genetic Screening discussed Integrated Screen: undecided, wants to check insurance Cystic fibrosis screening discussed undecided, wants to check insurance Ultrasound discussed; fetal survey: requested Follow up in 4 weeks for: will go ahead and schedule for 1st it/nt and visit, if she doesn't want nt/it can let us know when she comes in [facility] completed Will need PLTCS d/t h/o myomectomy x 3  [place], [facility] 07/15/2013 4:04 PM

## 2013-07-16 LAB — SICKLE CELL SCREEN: SICKLE CELL SCREEN: NEGATIVE

## 2013-07-16 LAB — URINALYSIS
BILIRUBIN URINE: NEGATIVE
GLUCOSE, UA: NEGATIVE mg/dL
Hgb urine dipstick: NEGATIVE
KETONES UR: NEGATIVE mg/dL
LEUKOCYTES UA: NEGATIVE
Nitrite: NEGATIVE
PH: 7.5 (ref 5.0–8.0)
PROTEIN: NEGATIVE mg/dL
SPECIFIC GRAVITY, URINE: 1.025 (ref 1.005–1.030)
UROBILINOGEN UA: 0.2 mg/dL (ref 0.0–1.0)

## 2013-07-16 LAB — DRUG SCREEN, URINE, NO CONFIRMATION
AMPHETAMINE SCRN UR: NEGATIVE
Barbiturate Quant, Ur: NEGATIVE
Benzodiazepines.: NEGATIVE
COCAINE METABOLITES: NEGATIVE
CREATININE, U: 190.7 mg/dL
MARIJUANA METABOLITE: NEGATIVE
METHADONE: NEGATIVE
OPIATE SCREEN, URINE: NEGATIVE
PROPOXYPHENE: NEGATIVE
Phencyclidine (PCP): NEGATIVE

## 2013-07-16 LAB — URINE CULTURE
COLONY COUNT: NO GROWTH
ORGANISM ID, BACTERIA: NO GROWTH

## 2013-07-16 LAB — VARICELLA ZOSTER ANTIBODY, IGG: VARICELLA IGG: 3210 {index} — AB (ref <135.00)

## 2013-07-16 LAB — ABO AND RH: Rh Type: POSITIVE

## 2013-07-16 LAB — ANTIBODY SCREEN: Antibody Screen: NEGATIVE

## 2013-07-16 LAB — HEPATITIS B SURFACE ANTIGEN: Hepatitis B Surface Ag: NEGATIVE

## 2013-07-16 LAB — HIV ANTIBODY (ROUTINE TESTING W REFLEX): HIV: NONREACTIVE

## 2013-07-16 LAB — RUBELLA SCREEN: Rubella: 10.1 Index — ABNORMAL HIGH (ref <0.90)

## 2013-07-16 LAB — RPR

## 2013-07-16 LAB — OXYCODONE SCREEN, UA, RFLX CONFIRM: OXYCODONE SCRN UR: NEGATIVE ng/mL

## 2013-07-21 ENCOUNTER — Encounter: Payer: Self-pay | Admitting: Women's Health

## 2013-08-10 ENCOUNTER — Other Ambulatory Visit: Payer: Self-pay | Admitting: Obstetrics & Gynecology

## 2013-08-10 DIAGNOSIS — Z36 Encounter for antenatal screening of mother: Secondary | ICD-10-CM

## 2013-08-13 ENCOUNTER — Ambulatory Visit: Payer: BC Managed Care – PPO

## 2013-08-13 ENCOUNTER — Ambulatory Visit (INDEPENDENT_AMBULATORY_CARE_PROVIDER_SITE_OTHER): Payer: BC Managed Care – PPO | Admitting: Advanced Practice Midwife

## 2013-08-13 ENCOUNTER — Encounter: Payer: Self-pay | Admitting: Advanced Practice Midwife

## 2013-08-13 VITALS — BP 116/66 | Wt 167.0 lb

## 2013-08-13 DIAGNOSIS — O99340 Other mental disorders complicating pregnancy, unspecified trimester: Secondary | ICD-10-CM

## 2013-08-13 DIAGNOSIS — Z1389 Encounter for screening for other disorder: Secondary | ICD-10-CM

## 2013-08-13 DIAGNOSIS — O3410 Maternal care for benign tumor of corpus uteri, unspecified trimester: Secondary | ICD-10-CM

## 2013-08-13 DIAGNOSIS — Z331 Pregnant state, incidental: Secondary | ICD-10-CM

## 2013-08-13 LAB — POCT URINALYSIS DIPSTICK
Blood, UA: NEGATIVE
Glucose, UA: NEGATIVE
Ketones, UA: NEGATIVE
LEUKOCYTES UA: NEGATIVE
NITRITE UA: NEGATIVE
Protein, UA: NEGATIVE

## 2013-08-13 NOTE — Progress Notes (Signed)
Had to r/s NT/IT today d/t ultrasound tech being out.  Still tired, nausea better.     No c/o at this time.  Routine questions about pregnancy answered.  F/U tomorrow for NT/IT and in 4 weeks for Low-risk ob appt .

## 2013-08-14 ENCOUNTER — Other Ambulatory Visit: Payer: BC Managed Care – PPO

## 2013-08-14 ENCOUNTER — Ambulatory Visit (INDEPENDENT_AMBULATORY_CARE_PROVIDER_SITE_OTHER): Payer: BC Managed Care – PPO

## 2013-08-14 DIAGNOSIS — Z3400 Encounter for supervision of normal first pregnancy, unspecified trimester: Secondary | ICD-10-CM

## 2013-08-14 DIAGNOSIS — Z36 Encounter for antenatal screening of mother: Secondary | ICD-10-CM

## 2013-08-14 NOTE — Progress Notes (Signed)
U/S(13+1wks)-single active fetus, CRL c/w dates, cx appears closed (5.0cm), bilateral adnexa appears wnl, multiple fibroids noted (2 largest=4.8cm), FHR-153 bpm, NB present, NT-1.19mm

## 2013-08-18 ENCOUNTER — Other Ambulatory Visit: Payer: BC Managed Care – PPO

## 2013-08-20 LAB — MATERNAL SCREEN, INTEGRATED #1

## 2013-09-14 ENCOUNTER — Encounter: Payer: Self-pay | Admitting: Obstetrics and Gynecology

## 2013-09-14 ENCOUNTER — Ambulatory Visit (INDEPENDENT_AMBULATORY_CARE_PROVIDER_SITE_OTHER): Payer: BC Managed Care – PPO | Admitting: Obstetrics and Gynecology

## 2013-09-14 VITALS — BP 116/82 | Wt 169.4 lb

## 2013-09-14 DIAGNOSIS — Z331 Pregnant state, incidental: Secondary | ICD-10-CM

## 2013-09-14 DIAGNOSIS — Z1389 Encounter for screening for other disorder: Secondary | ICD-10-CM

## 2013-09-14 DIAGNOSIS — O3410 Maternal care for benign tumor of corpus uteri, unspecified trimester: Secondary | ICD-10-CM

## 2013-09-14 DIAGNOSIS — Z3400 Encounter for supervision of normal first pregnancy, unspecified trimester: Secondary | ICD-10-CM

## 2013-09-14 DIAGNOSIS — D259 Leiomyoma of uterus, unspecified: Secondary | ICD-10-CM

## 2013-09-14 LAB — POCT URINALYSIS DIPSTICK
Glucose, UA: NEGATIVE
Ketones, UA: NEGATIVE
Leukocytes, UA: NEGATIVE
Nitrite, UA: NEGATIVE
PROTEIN UA: NEGATIVE
RBC UA: NEGATIVE

## 2013-09-14 NOTE — Progress Notes (Signed)
[redacted]w[redacted]d G1P0 presents for routine prenatal visit today. She denies vaginal bleeding, vaginal discharge. Fundal height is 16 cm, U-1 Having occas cramp in lower abd. No d/c. No bleeding  Anatomy scan in 2-3 wk

## 2013-09-17 LAB — MATERNAL SCREEN, INTEGRATED #2
AFP MOM MAT SCREEN: 1.08
AFP, Serum: 47.3 ng/mL
Age risk Down Syndrome: 1:500 {titer}
CALCULATED GESTATIONAL AGE MAT SCREEN: 17.9
CROWN RUMP LENGTH MAT SCREEN 2: 76.3 mm
Estriol Mom: 1.2
Estriol, Free: 1.45 ng/mL
HCG, SERUM MAT SCREEN: 52.2 [IU]/mL
Inhibin A Dimeric: 128 pg/mL
Inhibin A MoM: 0.84
MSS Down Syndrome: <1:5000 {titer}
MSS Trisomy 18 Risk: <1:5000 {titer}
NT MOM MAT SCREEN: 1.03
NUCHAL TRANSLUCENCY MAT SCREEN 2: 1.73 mm
Number of fetuses: 1
PAPP-A MoM: 0.88
PAPP-A: 1575 ng/mL
Rish for ONTD: <1:5000 {titer}
hCG MoM: 1.89

## 2013-09-30 ENCOUNTER — Other Ambulatory Visit: Payer: Self-pay | Admitting: Obstetrics and Gynecology

## 2013-09-30 DIAGNOSIS — O3412 Maternal care for benign tumor of corpus uteri, second trimester: Secondary | ICD-10-CM

## 2013-09-30 DIAGNOSIS — Z1389 Encounter for screening for other disorder: Secondary | ICD-10-CM

## 2013-10-05 ENCOUNTER — Ambulatory Visit (INDEPENDENT_AMBULATORY_CARE_PROVIDER_SITE_OTHER): Payer: BC Managed Care – PPO | Admitting: Women's Health

## 2013-10-05 ENCOUNTER — Encounter: Payer: Self-pay | Admitting: Women's Health

## 2013-10-05 ENCOUNTER — Ambulatory Visit (INDEPENDENT_AMBULATORY_CARE_PROVIDER_SITE_OTHER): Payer: BC Managed Care – PPO

## 2013-10-05 VITALS — BP 132/84 | Wt 173.0 lb

## 2013-10-05 DIAGNOSIS — Z1389 Encounter for screening for other disorder: Secondary | ICD-10-CM

## 2013-10-05 DIAGNOSIS — Z3402 Encounter for supervision of normal first pregnancy, second trimester: Secondary | ICD-10-CM

## 2013-10-05 DIAGNOSIS — Z331 Pregnant state, incidental: Secondary | ICD-10-CM

## 2013-10-05 DIAGNOSIS — O3410 Maternal care for benign tumor of corpus uteri, unspecified trimester: Secondary | ICD-10-CM

## 2013-10-05 DIAGNOSIS — O3412 Maternal care for benign tumor of corpus uteri, second trimester: Secondary | ICD-10-CM

## 2013-10-05 DIAGNOSIS — Z3400 Encounter for supervision of normal first pregnancy, unspecified trimester: Secondary | ICD-10-CM

## 2013-10-05 LAB — POCT URINALYSIS DIPSTICK
Glucose, UA: NEGATIVE
Ketones, UA: NEGATIVE
Leukocytes, UA: NEGATIVE
NITRITE UA: NEGATIVE
Protein, UA: NEGATIVE
RBC UA: NEGATIVE

## 2013-10-05 NOTE — Progress Notes (Signed)
Low-risk OB appointment G1P0 [redacted]w[redacted]d Estimated Date of Delivery: 02/18/14 BP 132/84  Wt 173 lb (78.472 kg)  LMP 05/14/2013  BP, weight, and urine reviewed.  Refer to obstetrical flow sheet for FH & FHR.  Not feeling fm yet, reassured. Placenta is anterior- so this may be reason.  Denies regular uc's, lof, vb, or uti s/s. No complaints. Reviewed today's anatomy u/s, it's a girl, fibroids are stable.  Also discussed ptl s/s.  Plan:  Continue routine obstetrical care  F/U in 4wks for OB appointment

## 2013-10-05 NOTE — Patient Instructions (Signed)
Second Trimester of Pregnancy The second trimester is from week 13 through week 28, months 4 through 6. The second trimester is often a time when you feel your best. Your body has also adjusted to being pregnant, and you begin to feel better physically. Usually, morning sickness has lessened or quit completely, you may have more energy, and you may have an increase in appetite. The second trimester is also a time when the fetus is growing rapidly. At the end of the sixth month, the fetus is about 9 inches long and weighs about 1 pounds. You will likely begin to feel the baby move (quickening) between 18 and 20 weeks of the pregnancy. BODY CHANGES Your body goes through many changes during pregnancy. The changes vary from woman to woman.   Your weight will continue to increase. You will notice your lower abdomen bulging out.  You may begin to get stretch marks on your hips, abdomen, and breasts.  You may develop headaches that can be relieved by medicines approved by your health care provider.  You may urinate more often because the fetus is pressing on your bladder.  You may develop or continue to have heartburn as a result of your pregnancy.  You may develop constipation because certain hormones are causing the muscles that push waste through your intestines to slow down.  You may develop hemorrhoids or swollen, bulging veins (varicose veins).  You may have back pain because of the weight gain and pregnancy hormones relaxing your joints between the bones in your pelvis and as a result of a shift in weight and the muscles that support your balance.  Your breasts will continue to grow and be tender.  Your gums may bleed and may be sensitive to brushing and flossing.  Dark spots or blotches (chloasma, mask of pregnancy) may develop on your face. This will likely fade after the baby is born.  A dark line from your belly button to the pubic area (linea nigra) may appear. This will likely fade  after the baby is born.  You may have changes in your hair. These can include thickening of your hair, rapid growth, and changes in texture. Some women also have hair loss during or after pregnancy, or hair that feels dry or thin. Your hair will most likely return to normal after your baby is born. WHAT TO EXPECT AT YOUR PRENATAL VISITS During a routine prenatal visit:  You will be weighed to make sure you and the fetus are growing normally.  Your blood pressure will be taken.  Your abdomen will be measured to track your baby's growth.  The fetal heartbeat will be listened to.  Any test results from the previous visit will be discussed. Your health care provider may ask you:  How you are feeling.  If you are feeling the baby move.  If you have had any abnormal symptoms, such as leaking fluid, bleeding, severe headaches, or abdominal cramping.  If you have any questions. Other tests that may be performed during your second trimester include:  Blood tests that check for:  Low iron levels (anemia).  Gestational diabetes (between 24 and 28 weeks).  Rh antibodies.  Urine tests to check for infections, diabetes, or protein in the urine.  An ultrasound to confirm the proper growth and development of the baby.  An amniocentesis to check for possible genetic problems.  Fetal screens for spina bifida and Down syndrome. HOME CARE INSTRUCTIONS   Avoid all smoking, herbs, alcohol, and unprescribed   drugs. These chemicals affect the formation and growth of the baby.  Follow your health care provider's instructions regarding medicine use. There are medicines that are either safe or unsafe to take during pregnancy.  Exercise only as directed by your health care provider. Experiencing uterine cramps is a good sign to stop exercising.  Continue to eat regular, healthy meals.  Wear a good support bra for breast tenderness.  Do not use hot tubs, steam rooms, or saunas.  Wear your  seat belt at all times when driving.  Avoid raw meat, uncooked cheese, cat litter boxes, and soil used by cats. These carry germs that can cause birth defects in the baby.  Take your prenatal vitamins.  Try taking a stool softener (if your health care provider approves) if you develop constipation. Eat more high-fiber foods, such as fresh vegetables or fruit and whole grains. Drink plenty of fluids to keep your urine clear or pale yellow.  Take warm sitz baths to soothe any pain or discomfort caused by hemorrhoids. Use hemorrhoid cream if your health care provider approves.  If you develop varicose veins, wear support hose. Elevate your feet for 15 minutes, 3-4 times a day. Limit salt in your diet.  Avoid heavy lifting, wear low heel shoes, and practice good posture.  Rest with your legs elevated if you have leg cramps or low back pain.  Visit your dentist if you have not gone yet during your pregnancy. Use a soft toothbrush to brush your teeth and be gentle when you floss.  A sexual relationship may be continued unless your health care provider directs you otherwise.  Continue to go to all your prenatal visits as directed by your health care provider. SEEK MEDICAL CARE IF:   You have dizziness.  You have mild pelvic cramps, pelvic pressure, or nagging pain in the abdominal area.  You have persistent nausea, vomiting, or diarrhea.  You have a bad smelling vaginal discharge.  You have pain with urination. SEEK IMMEDIATE MEDICAL CARE IF:   You have a fever.  You are leaking fluid from your vagina.  You have spotting or bleeding from your vagina.  You have severe abdominal cramping or pain.  You have rapid weight gain or loss.  You have shortness of breath with chest pain.  You notice sudden or extreme swelling of your face, hands, ankles, feet, or legs.  You have not felt your baby move in over an hour.  You have severe headaches that do not go away with  medicine.  You have vision changes. Document Released: 03/20/2001 Document Revised: 03/31/2013 Document Reviewed: 05/27/2012 [employer] Patient Information 2015 [employer]. This information is not intended to replace advice given to you by your health care provider. Make sure you discuss any questions you have with your health care provider.  

## 2013-10-05 NOTE — Progress Notes (Signed)
U/S(20+4wks)-active transverse fetus, FHR-158 bpm, anterior Gr 0 placenta, cx appears closed (5.0cm), bilateral adnexa appears WNL, multiple fibroids noted (7 in # and largest-5.0cm) female fetus, no obvious abnl noted although difficult to evaluate due to fetal position and previous myomectomy incision.

## 2013-10-05 NOTE — Progress Notes (Signed)
Pt denies any problems or concerns at this time.  

## 2013-10-22 ENCOUNTER — Telehealth: Payer: Self-pay | Admitting: Obstetrics & Gynecology

## 2013-10-22 NOTE — Telephone Encounter (Signed)
Pt c/o pelvic pressure and cramping, no vaginal bleeding, +FM. Pt states cramping has "eased up."  Pt informed to push fluids, and lay on right side, take tylenol if no improvement call our office back. Pt verbalized understanding.

## 2013-10-23 ENCOUNTER — Ambulatory Visit (INDEPENDENT_AMBULATORY_CARE_PROVIDER_SITE_OTHER): Payer: BC Managed Care – PPO | Admitting: Obstetrics and Gynecology

## 2013-10-23 ENCOUNTER — Telehealth: Payer: Self-pay | Admitting: *Deleted

## 2013-10-23 ENCOUNTER — Encounter: Payer: Self-pay | Admitting: Obstetrics and Gynecology

## 2013-10-23 VITALS — BP 124/72

## 2013-10-23 DIAGNOSIS — Z3402 Encounter for supervision of normal first pregnancy, second trimester: Secondary | ICD-10-CM

## 2013-10-23 DIAGNOSIS — Z331 Pregnant state, incidental: Secondary | ICD-10-CM

## 2013-10-23 DIAGNOSIS — Z1389 Encounter for screening for other disorder: Secondary | ICD-10-CM

## 2013-10-23 DIAGNOSIS — Z3400 Encounter for supervision of normal first pregnancy, unspecified trimester: Secondary | ICD-10-CM

## 2013-10-23 LAB — POCT URINALYSIS DIPSTICK
Blood, UA: NEGATIVE
Glucose, UA: NEGATIVE
Ketones, UA: NEGATIVE
Leukocytes, UA: NEGATIVE
Nitrite, UA: NEGATIVE
PROTEIN UA: NEGATIVE

## 2013-10-23 MED ORDER — ACETAMINOPHEN-CODEINE #3 300-30 MG PO TABS: 1.0000 | ORAL_TABLET | Freq: Four times a day (QID) | ORAL | Status: DC | PRN

## 2013-10-23 NOTE — Telephone Encounter (Signed)
Pt states continues to have cramping increasingly worse, +FM, no vaginal bleeding. Pt states pushed fluids and tylenol yesterday as advised with no improvement.  Pt to come in today to be evaluated.

## 2013-10-23 NOTE — Progress Notes (Signed)
G1P0 [redacted]w[redacted]d Estimated Date of Delivery: 02/18/14  Blood pressure 124/72, last menstrual period 05/14/2013.   BP weight and urine results all reviewed and noted.  Please refer to the obstetrical flow sheet for the fundal height and fetal heart rate documentation:  Patient reports good fetal movement, denies any bleeding and no rupture of membranes symptoms or regular contractions. Pt states that she has been having some cramping for the last couple days. Pt states that the cramping was coming and going until last night and it has been constant since then. Pt denies any bleeding or gush of fluid. Pt states that she has been having some pelvic pressure as well. Pt states that she has had some urinary frequency. . All questions were answered. Vag exam Normal support, cervix deviated to pt right. Posterior cx, appears normal Had transvaginal US- Cervix measured 3.3, 3.5 cm cervix on TV u/.s. Vertex fetus,  Plan:  Continued routine obstetrical care, Take Tylenol for acute pain  Follow up in 2 weeks for OB appointment

## 2013-10-23 NOTE — Progress Notes (Signed)
Pt states that she has been having some cramping for the last couple days. Pt states that the cramping was coming and going until last night and it has been constant since then. Pt denies any bleeding or gush of fluid. Pt states that she has been having some pelvic pressure as well. Pt states that she has had some urinary frequency.

## 2013-11-02 ENCOUNTER — Encounter: Payer: Self-pay | Admitting: Women's Health

## 2013-11-02 ENCOUNTER — Ambulatory Visit (INDEPENDENT_AMBULATORY_CARE_PROVIDER_SITE_OTHER): Payer: BC Managed Care – PPO | Admitting: Women's Health

## 2013-11-02 VITALS — BP 118/68 | Wt 178.0 lb

## 2013-11-02 DIAGNOSIS — Z3402 Encounter for supervision of normal first pregnancy, second trimester: Secondary | ICD-10-CM

## 2013-11-02 DIAGNOSIS — Z3400 Encounter for supervision of normal first pregnancy, unspecified trimester: Secondary | ICD-10-CM

## 2013-11-02 DIAGNOSIS — Z331 Pregnant state, incidental: Secondary | ICD-10-CM

## 2013-11-02 DIAGNOSIS — O3410 Maternal care for benign tumor of corpus uteri, unspecified trimester: Secondary | ICD-10-CM

## 2013-11-02 DIAGNOSIS — D259 Leiomyoma of uterus, unspecified: Secondary | ICD-10-CM

## 2013-11-02 DIAGNOSIS — Z1383 Encounter for screening for respiratory disorder NEC: Secondary | ICD-10-CM

## 2013-11-02 LAB — POCT URINALYSIS DIPSTICK
Blood, UA: NEGATIVE
GLUCOSE UA: NEGATIVE
Ketones, UA: NEGATIVE
Leukocytes, UA: NEGATIVE
Nitrite, UA: NEGATIVE
Protein, UA: NEGATIVE

## 2013-11-02 NOTE — Progress Notes (Signed)
Low-risk OB appointment G1P0 [redacted]w[redacted]d Estimated Date of Delivery: 02/18/14 BP 118/68  Wt 178 lb (80.74 kg)  LMP 05/14/2013  BP, weight, and urine reviewed.  Refer to obstetrical flow sheet for FH & FHR.  Reports good fm.  Denies regular uc's, lof, vb, or uti s/s. No further cramping since last visit.  Scheduled for cb classes in Sept.  Reviewed ptl s/s, fm. Plan:  Continue routine obstetrical care  F/U in 4wks for OB appointment, pn2, and u/s to assess fibroids/efw/afi

## 2013-11-02 NOTE — Patient Instructions (Signed)
You will have your sugar test next visit.  Please do not eat or drink anything after midnight the night before you come, not even water.  You will be here for at least two hours.     Second Trimester of Pregnancy The second trimester is from week 13 through week 28, months 4 through 6. The second trimester is often a time when you feel your best. Your body has also adjusted to being pregnant, and you begin to feel better physically. Usually, morning sickness has lessened or quit completely, you may have more energy, and you may have an increase in appetite. The second trimester is also a time when the fetus is growing rapidly. At the end of the sixth month, the fetus is about 9 inches long and weighs about 1 pounds. You will likely begin to feel the baby move (quickening) between 18 and 20 weeks of the pregnancy. BODY CHANGES Your body goes through many changes during pregnancy. The changes vary from woman to woman.   Your weight will continue to increase. You will notice your lower abdomen bulging out.  You may begin to get stretch marks on your hips, abdomen, and breasts.  You may develop headaches that can be relieved by medicines approved by your health care provider.  You may urinate more often because the fetus is pressing on your bladder.  You may develop or continue to have heartburn as a result of your pregnancy.  You may develop constipation because certain hormones are causing the muscles that push waste through your intestines to slow down.  You may develop hemorrhoids or swollen, bulging veins (varicose veins).  You may have back pain because of the weight gain and pregnancy hormones relaxing your joints between the bones in your pelvis and as a result of a shift in weight and the muscles that support your balance.  Your breasts will continue to grow and be tender.  Your gums may bleed and may be sensitive to brushing and flossing.  Dark spots or blotches (chloasma, mask of  pregnancy) may develop on your face. This will likely fade after the baby is born.  A dark line from your belly button to the pubic area (linea nigra) may appear. This will likely fade after the baby is born.  You may have changes in your hair. These can include thickening of your hair, rapid growth, and changes in texture. Some women also have hair loss during or after pregnancy, or hair that feels dry or thin. Your hair will most likely return to normal after your baby is born. WHAT TO EXPECT AT YOUR PRENATAL VISITS During a routine prenatal visit:  You will be weighed to make sure you and the fetus are growing normally.  Your blood pressure will be taken.  Your abdomen will be measured to track your baby's growth.  The fetal heartbeat will be listened to.  Any test results from the previous visit will be discussed. Your health care provider may ask you:  How you are feeling.  If you are feeling the baby move.  If you have had any abnormal symptoms, such as leaking fluid, bleeding, severe headaches, or abdominal cramping.  If you have any questions. Other tests that may be performed during your second trimester include:  Blood tests that check for:  Low iron levels (anemia).  Gestational diabetes (between 24 and 28 weeks).  Rh antibodies.  Urine tests to check for infections, diabetes, or protein in the urine.  An ultrasound  to confirm the proper growth and development of the baby.  An amniocentesis to check for possible genetic problems.  Fetal screens for spina bifida and Down syndrome. HOME CARE INSTRUCTIONS   Avoid all smoking, herbs, alcohol, and unprescribed drugs. These chemicals affect the formation and growth of the baby.  Follow your health care provider's instructions regarding medicine use. There are medicines that are either safe or unsafe to take during pregnancy.  Exercise only as directed by your health care provider. Experiencing uterine cramps is a  good sign to stop exercising.  Continue to eat regular, healthy meals.  Wear a good support bra for breast tenderness.  Do not use hot tubs, steam rooms, or saunas.  Wear your seat belt at all times when driving.  Avoid raw meat, uncooked cheese, cat litter boxes, and soil used by cats. These carry germs that can cause birth defects in the baby.  Take your prenatal vitamins.  Try taking a stool softener (if your health care provider approves) if you develop constipation. Eat more high-fiber foods, such as fresh vegetables or fruit and whole grains. Drink plenty of fluids to keep your urine clear or pale yellow.  Take warm sitz baths to soothe any pain or discomfort caused by hemorrhoids. Use hemorrhoid cream if your health care provider approves.  If you develop varicose veins, wear support hose. Elevate your feet for 15 minutes, 3-4 times a day. Limit salt in your diet.  Avoid heavy lifting, wear low heel shoes, and practice good posture.  Rest with your legs elevated if you have leg cramps or low back pain.  Visit your dentist if you have not gone yet during your pregnancy. Use a soft toothbrush to brush your teeth and be gentle when you floss.  A sexual relationship may be continued unless your health care provider directs you otherwise.  Continue to go to all your prenatal visits as directed by your health care provider. SEEK MEDICAL CARE IF:   You have dizziness.  You have mild pelvic cramps, pelvic pressure, or nagging pain in the abdominal area.  You have persistent nausea, vomiting, or diarrhea.  You have a bad smelling vaginal discharge.  You have pain with urination. SEEK IMMEDIATE MEDICAL CARE IF:   You have a fever.  You are leaking fluid from your vagina.  You have spotting or bleeding from your vagina.  You have severe abdominal cramping or pain.  You have rapid weight gain or loss.  You have shortness of breath with chest pain.  You notice sudden  or extreme swelling of your face, hands, ankles, feet, or legs.  You have not felt your baby move in over an hour.  You have severe headaches that do not go away with medicine.  You have vision changes. Document Released: 03/20/2001 Document Revised: 03/31/2013 Document Reviewed: 05/27/2012 [facility] Patient Information 2015 [place], [place]. This information is not intended to replace advice given to you by your health care provider. Make sure you discuss any questions you have with your health care provider.

## 2013-11-30 ENCOUNTER — Ambulatory Visit (INDEPENDENT_AMBULATORY_CARE_PROVIDER_SITE_OTHER): Payer: BC Managed Care – PPO

## 2013-11-30 ENCOUNTER — Ambulatory Visit (INDEPENDENT_AMBULATORY_CARE_PROVIDER_SITE_OTHER): Payer: BC Managed Care – PPO | Admitting: Obstetrics and Gynecology

## 2013-11-30 ENCOUNTER — Other Ambulatory Visit: Payer: BC Managed Care – PPO

## 2013-11-30 ENCOUNTER — Other Ambulatory Visit: Payer: Self-pay | Admitting: Obstetrics and Gynecology

## 2013-11-30 ENCOUNTER — Encounter: Payer: Self-pay | Admitting: Obstetrics and Gynecology

## 2013-11-30 VITALS — BP 122/78 | Wt 181.8 lb

## 2013-11-30 DIAGNOSIS — Z3403 Encounter for supervision of normal first pregnancy, third trimester: Secondary | ICD-10-CM

## 2013-11-30 DIAGNOSIS — Z1389 Encounter for screening for other disorder: Secondary | ICD-10-CM

## 2013-11-30 DIAGNOSIS — Z3402 Encounter for supervision of normal first pregnancy, second trimester: Secondary | ICD-10-CM

## 2013-11-30 DIAGNOSIS — Z1159 Encounter for screening for other viral diseases: Secondary | ICD-10-CM

## 2013-11-30 DIAGNOSIS — Z131 Encounter for screening for diabetes mellitus: Secondary | ICD-10-CM

## 2013-11-30 DIAGNOSIS — O3410 Maternal care for benign tumor of corpus uteri, unspecified trimester: Secondary | ICD-10-CM

## 2013-11-30 DIAGNOSIS — Z0184 Encounter for antibody response examination: Secondary | ICD-10-CM

## 2013-11-30 DIAGNOSIS — Z331 Pregnant state, incidental: Secondary | ICD-10-CM

## 2013-11-30 DIAGNOSIS — Z3400 Encounter for supervision of normal first pregnancy, unspecified trimester: Secondary | ICD-10-CM

## 2013-11-30 DIAGNOSIS — D259 Leiomyoma of uterus, unspecified: Secondary | ICD-10-CM

## 2013-11-30 LAB — POCT URINALYSIS DIPSTICK
Blood, UA: NEGATIVE
Glucose, UA: NEGATIVE
Ketones, UA: NEGATIVE
Leukocytes, UA: NEGATIVE
NITRITE UA: NEGATIVE
Protein, UA: NEGATIVE

## 2013-11-30 LAB — CBC
HCT: 35.2 % — ABNORMAL LOW (ref 36.0–46.0)
Hemoglobin: 12.5 g/dL (ref 12.0–15.0)
MCH: 31.3 pg (ref 26.0–34.0)
MCHC: 35.5 g/dL (ref 30.0–36.0)
MCV: 88.2 fL (ref 78.0–100.0)
Platelets: 174 10*3/uL (ref 150–400)
RBC: 3.99 MIL/uL (ref 3.87–5.11)
RDW: 13.8 % (ref 11.5–15.5)
WBC: 6 10*3/uL (ref 4.0–10.5)

## 2013-11-30 NOTE — Progress Notes (Signed)
G1P0 [redacted]w[redacted]d Estimated Date of Delivery: 02/18/14  Blood pressure 122/78, weight 181 lb 12.8 oz (82.464 kg), last menstrual period 05/14/2013.   refer to the ob flow sheet for FH and FHR, also BP, Wt, Urine results: notable for nothing   Patient reports good fetal movement, denies any bleeding and no rupture of membranes symptoms or regular contractions. Patient complaints: none at this time.  Questions were answered. Plan:  Continued routine obstetrical care, schedule cesarean section on 02/11/14  F/u in 2 weeks for prenatal visit

## 2013-11-30 NOTE — Progress Notes (Signed)
U/S(28+4wks)-vtx active fetus, EFW 2 lb 11 oz (48th%tile), fluid WNL SDP-5.9cm, largest fibroid=5.6cm, FHR-144 bpm, female fetus, anterior Gr 1 placenta, cx appears closed (3.6cm),

## 2013-12-01 ENCOUNTER — Other Ambulatory Visit: Payer: Self-pay | Admitting: Obstetrics and Gynecology

## 2013-12-01 LAB — RPR

## 2013-12-01 LAB — HSV 2 ANTIBODY, IGG: HSV 2 Glycoprotein G Ab, IgG: <0.1 IV

## 2013-12-01 LAB — GLUCOSE TOLERANCE, 2 HOURS W/ 1HR
Glucose, 1 hour: 120 mg/dL (ref 70–170)
Glucose, 2 hour: 116 mg/dL (ref 70–139)
Glucose, Fasting: 73 mg/dL (ref 70–99)

## 2013-12-01 LAB — HIV ANTIBODY (ROUTINE TESTING W REFLEX): HIV 1&2 Ab, 4th Generation: NONREACTIVE

## 2013-12-01 LAB — ANTIBODY SCREEN: Antibody Screen: NEGATIVE

## 2013-12-08 ENCOUNTER — Encounter: Payer: Self-pay | Admitting: Women's Health

## 2013-12-21 ENCOUNTER — Encounter: Payer: Self-pay | Admitting: Obstetrics and Gynecology

## 2013-12-21 ENCOUNTER — Ambulatory Visit (INDEPENDENT_AMBULATORY_CARE_PROVIDER_SITE_OTHER): Payer: BC Managed Care – PPO | Admitting: Obstetrics and Gynecology

## 2013-12-21 VITALS — BP 110/66 | Wt 184.0 lb

## 2013-12-21 DIAGNOSIS — Z3400 Encounter for supervision of normal first pregnancy, unspecified trimester: Secondary | ICD-10-CM

## 2013-12-21 DIAGNOSIS — Z1389 Encounter for screening for other disorder: Secondary | ICD-10-CM

## 2013-12-21 DIAGNOSIS — Z9889 Other specified postprocedural states: Secondary | ICD-10-CM

## 2013-12-21 DIAGNOSIS — Z331 Pregnant state, incidental: Secondary | ICD-10-CM

## 2013-12-21 LAB — POCT URINALYSIS DIPSTICK
Glucose, UA: NEGATIVE
Ketones, UA: NEGATIVE
Leukocytes, UA: NEGATIVE
Nitrite, UA: NEGATIVE
Protein, UA: NEGATIVE
RBC UA: NEGATIVE

## 2013-12-21 NOTE — Progress Notes (Signed)
G1P0 [redacted]w[redacted]d Estimated Date of Delivery: 02/18/14  Blood pressure 110/66, weight 184 lb (83.462 kg), last menstrual period 05/14/2013.   refer to the ob flow sheet for FH and FHR, also BP, Wt, Urine results: negative  Patient reports good fetal movement, denies any bleeding and no rupture of membranes symptoms or regular contractions.  She has not experienced anymore cramping or contractions. Patient complaints: None.   Assessment: [redacted]w[redacted]d, G1P0 Plan:  Continued routine obstetrical care, F/u in 1 week   This chart was scribed by Donato Schultz, Medical Scribe, for Dr. Mallory Shirk on 12/17/2013 at 4:31 PM. This chart was reviewed by Dr. Mallory Shirk for accuracy.

## 2013-12-21 NOTE — Progress Notes (Deleted)
Pt denies any problems or concerns at this time.  

## 2013-12-30 DIAGNOSIS — Z029 Encounter for administrative examinations, unspecified: Secondary | ICD-10-CM

## 2014-01-04 ENCOUNTER — Encounter: Payer: Self-pay | Admitting: Obstetrics and Gynecology

## 2014-01-04 ENCOUNTER — Ambulatory Visit (INDEPENDENT_AMBULATORY_CARE_PROVIDER_SITE_OTHER): Payer: BC Managed Care – PPO | Admitting: Obstetrics and Gynecology

## 2014-01-04 VITALS — BP 120/80 | Wt 187.5 lb

## 2014-01-04 DIAGNOSIS — D259 Leiomyoma of uterus, unspecified: Secondary | ICD-10-CM | POA: Insufficient documentation

## 2014-01-04 DIAGNOSIS — Z1389 Encounter for screening for other disorder: Secondary | ICD-10-CM

## 2014-01-04 DIAGNOSIS — Z3493 Encounter for supervision of normal pregnancy, unspecified, third trimester: Secondary | ICD-10-CM

## 2014-01-04 DIAGNOSIS — O3410 Maternal care for benign tumor of corpus uteri, unspecified trimester: Secondary | ICD-10-CM

## 2014-01-04 DIAGNOSIS — Z331 Pregnant state, incidental: Secondary | ICD-10-CM

## 2014-01-04 DIAGNOSIS — Z3400 Encounter for supervision of normal first pregnancy, unspecified trimester: Secondary | ICD-10-CM

## 2014-01-04 DIAGNOSIS — Z3490 Encounter for supervision of normal pregnancy, unspecified, unspecified trimester: Secondary | ICD-10-CM | POA: Insufficient documentation

## 2014-01-04 DIAGNOSIS — O3413 Maternal care for benign tumor of corpus uteri, third trimester: Secondary | ICD-10-CM

## 2014-01-04 LAB — POCT URINALYSIS DIPSTICK
GLUCOSE UA: NEGATIVE
Ketones, UA: NEGATIVE
Leukocytes, UA: NEGATIVE
Nitrite, UA: NEGATIVE
Protein, UA: NEGATIVE
RBC UA: NEGATIVE

## 2014-01-04 NOTE — Progress Notes (Signed)
Pt denies any problems or concerns at this time.  

## 2014-01-04 NOTE — Progress Notes (Signed)
G1P0 [redacted]w[redacted]d Estimated Date of Delivery: 02/18/14  Blood pressure 120/80, weight 85.049 kg (187 lb 8 oz), last menstrual period 05/14/2013.   refer to the ob flow sheet for FH and FHR, also BP, Wt, Urine results:notable for negative protein and glucose  Patient reports   good fetal movement, denies any bleeding and no rupture of membranes symptoms or regular contractions. Patient complaints:none , having braxton hicks..  Questions were answered. Plan:  Continued routine obstetrical care, q 2w  F/u in 2 weeks for pnx then u/s

## 2014-01-18 ENCOUNTER — Ambulatory Visit (INDEPENDENT_AMBULATORY_CARE_PROVIDER_SITE_OTHER): Payer: BC Managed Care – PPO | Admitting: Obstetrics and Gynecology

## 2014-01-18 VITALS — BP 134/82 | Wt 193.0 lb

## 2014-01-18 DIAGNOSIS — Z3403 Encounter for supervision of normal first pregnancy, third trimester: Secondary | ICD-10-CM

## 2014-01-18 DIAGNOSIS — Z331 Pregnant state, incidental: Secondary | ICD-10-CM

## 2014-01-18 DIAGNOSIS — Z1389 Encounter for screening for other disorder: Secondary | ICD-10-CM

## 2014-01-18 LAB — POCT URINALYSIS DIPSTICK
Blood, UA: NEGATIVE
GLUCOSE UA: NEGATIVE
KETONES UA: NEGATIVE
Leukocytes, UA: NEGATIVE
Nitrite, UA: NEGATIVE
PROTEIN UA: NEGATIVE

## 2014-01-18 NOTE — Progress Notes (Signed)
Patient ID: Julie Robbins, female   DOB: 1981-01-29, 33 y.o.   MRN: [id number] G1P0 [redacted]w[redacted]d Estimated Date of Delivery: 02/18/14  Blood pressure 134/82, weight 193 lb (87.544 kg), last menstrual period 05/14/2013.   BP weight and urine results all reviewed and noted.  Please refer to the obstetrical flow sheet for the fundal height and fetal heart rate documentation:  Patient reports good fetal movement, denies any bleeding and no rupture of membranes symptoms or regular contractions. Patient is without complaints. FH - 36cm FHR - 140 All questions were answered.  Assessment: [redacted]w[redacted]d, G1P0  Plan:  Continued routine obstetrical care,  Follow up in 1 weeks for OB appointment, to monitor BP   This chart was scribed for [name], MD by Donato Schultz, ED Scribe. This patient was seen in Room 1 and the patient's care was started at 4:33 PM.

## 2014-01-25 ENCOUNTER — Ambulatory Visit (INDEPENDENT_AMBULATORY_CARE_PROVIDER_SITE_OTHER): Payer: BC Managed Care – PPO | Admitting: Obstetrics and Gynecology

## 2014-01-25 ENCOUNTER — Encounter: Payer: Self-pay | Admitting: Obstetrics and Gynecology

## 2014-01-25 VITALS — BP 120/76 | Wt 190.0 lb

## 2014-01-25 DIAGNOSIS — O3410 Maternal care for benign tumor of corpus uteri, unspecified trimester: Secondary | ICD-10-CM

## 2014-01-25 DIAGNOSIS — Z1159 Encounter for screening for other viral diseases: Secondary | ICD-10-CM

## 2014-01-25 DIAGNOSIS — Z3403 Encounter for supervision of normal first pregnancy, third trimester: Secondary | ICD-10-CM

## 2014-01-25 DIAGNOSIS — Z331 Pregnant state, incidental: Secondary | ICD-10-CM

## 2014-01-25 DIAGNOSIS — Z118 Encounter for screening for other infectious and parasitic diseases: Secondary | ICD-10-CM

## 2014-01-25 DIAGNOSIS — Z3685 Encounter for antenatal screening for Streptococcus B: Secondary | ICD-10-CM

## 2014-01-25 DIAGNOSIS — D259 Leiomyoma of uterus, unspecified: Secondary | ICD-10-CM

## 2014-01-25 DIAGNOSIS — Z1389 Encounter for screening for other disorder: Secondary | ICD-10-CM

## 2014-01-25 LAB — POCT URINALYSIS DIPSTICK
Blood, UA: NEGATIVE
GLUCOSE UA: NEGATIVE
Ketones, UA: NEGATIVE
Leukocytes, UA: NEGATIVE
Nitrite, UA: NEGATIVE
Protein, UA: NEGATIVE

## 2014-01-25 NOTE — Progress Notes (Signed)
Pt denies any problems or concerns at this time.  

## 2014-01-25 NOTE — Progress Notes (Signed)
Patient ID: SHELVIA FOJTIK, female   DOB: [birth year], 33 y.o.   MRN: [id number] S/p myomectomy, fibroids, for primary C/s 39wk G1P0 [redacted]w[redacted]d Estimated Date of Delivery: 02/18/14  Blood pressure 120/76, weight 190 lb (86.183 kg), last menstrual period 05/14/2013.   refer to the ob flow sheet for FH and FHR, also BP, Wt, Urine results:negative   Patient reports +good fetal movement, denies any bleeding and no rupture of membranes symptoms or regular contractions. Patient complaints:None.  Physical Exam: FH - 36cm FHR - 127  Edema - reflexes 1+  Questions were answered. Assessment: [redacted]w[redacted]d, G1P0  Plan:  Continued routine obstetrical care,   F/u in 1 week    This chart was scribed for [name], MD by Donato Schultz, ED Scribe. This patient was seen in Room 2 and the patient's care was started at 4:55 PM.

## 2014-01-26 LAB — GC/CHLAMYDIA PROBE AMP
CT PROBE, AMP APTIMA: NEGATIVE
GC PROBE AMP APTIMA: NEGATIVE

## 2014-01-27 LAB — STREP B DNA PROBE: STREP GROUP B AG: DETECTED

## 2014-01-28 ENCOUNTER — Encounter: Payer: Self-pay | Admitting: Obstetrics and Gynecology

## 2014-02-01 ENCOUNTER — Ambulatory Visit (INDEPENDENT_AMBULATORY_CARE_PROVIDER_SITE_OTHER): Payer: BC Managed Care – PPO

## 2014-02-01 ENCOUNTER — Other Ambulatory Visit: Payer: Self-pay | Admitting: Obstetrics and Gynecology

## 2014-02-01 ENCOUNTER — Encounter: Payer: Self-pay | Admitting: Obstetrics and Gynecology

## 2014-02-01 ENCOUNTER — Ambulatory Visit (INDEPENDENT_AMBULATORY_CARE_PROVIDER_SITE_OTHER): Payer: BC Managed Care – PPO | Admitting: Obstetrics and Gynecology

## 2014-02-01 VITALS — BP 120/80 | Wt 192.0 lb

## 2014-02-01 DIAGNOSIS — O26843 Uterine size-date discrepancy, third trimester: Secondary | ICD-10-CM

## 2014-02-01 DIAGNOSIS — Z331 Pregnant state, incidental: Secondary | ICD-10-CM

## 2014-02-01 DIAGNOSIS — Z23 Encounter for immunization: Secondary | ICD-10-CM

## 2014-02-01 DIAGNOSIS — D259 Leiomyoma of uterus, unspecified: Secondary | ICD-10-CM

## 2014-02-01 DIAGNOSIS — IMO0002 Reserved for concepts with insufficient information to code with codable children: Secondary | ICD-10-CM

## 2014-02-01 DIAGNOSIS — Z3403 Encounter for supervision of normal first pregnancy, third trimester: Secondary | ICD-10-CM

## 2014-02-01 DIAGNOSIS — O3413 Maternal care for benign tumor of corpus uteri, third trimester: Principal | ICD-10-CM

## 2014-02-01 DIAGNOSIS — Z1389 Encounter for screening for other disorder: Secondary | ICD-10-CM

## 2014-02-01 LAB — POCT URINALYSIS DIPSTICK
Blood, UA: NEGATIVE
Glucose, UA: NEGATIVE
Ketones, UA: NEGATIVE
Leukocytes, UA: NEGATIVE
Nitrite, UA: NEGATIVE
PROTEIN UA: NEGATIVE

## 2014-02-01 NOTE — Progress Notes (Signed)
Pt denies any problems or concerns at this time.  

## 2014-02-01 NOTE — Progress Notes (Signed)
U/S(37+4wks)-vtx active fetus, EFW 6 lb 3 oz (23rd%tile),fluid WNL AFI-6.5cm SDP-2.7cm, anterior Gr 2 placenta, FHR-131 bpm, female fetus, multiple fibroids noted within the fundus Largest=5.6cm

## 2014-02-01 NOTE — Progress Notes (Signed)
Patient ID: Julie Robbins, female   DOB: June 26, 1980, 33 y.o.   MRN: [id number] G1P0 [redacted]w[redacted]d Estimated Date of Delivery: 02/18/14  Blood pressure 120/80, weight 192 lb (87.091 kg), last menstrual period 05/14/2013.   refer to the ob flow sheet for FH and FHR, also BP, Wt, Urine results:negative  Patient reports +good fetal movement, denies any bleeding and no rupture of membranes symptoms or regular contractions. Patient complaints: None. U/s for EFW done: EFW 6 lb 3 oz.  FH - 38cm FHR - 130  Assessment: [redacted]w[redacted]d, G1P0  Prior myomectomy x 2 surgeries, not for labor. Plan:  Continued routine obstetrical care.  Questions were answered.  Ceaserean scheduled for 11/5.  Follow-up and U/S scheduled for 1 wk.  This chart was scribed for [name], MD by Donato Schultz, ED Scribe. This patient was seen in Room 1 and the patient's care was started at 4:07 PM.

## 2014-02-02 ENCOUNTER — Other Ambulatory Visit: Payer: Self-pay | Admitting: Obstetrics and Gynecology

## 2014-02-02 NOTE — H&P (Signed)
Julie Robbins is a 33 y.o. female presenting for primary cesarean Section at 39 weeks. She has a history of having had multiple myomectomy x 2, and is not advised to try labor. Pregnancy has been uneventful to date, with estimated fetal weight of 6 lb and 3 oz at u/s 02/01/14. History OB History   Grav Para Term Preterm Abortions TAB SAB Ect Mult Living   1              Past Medical History  Diagnosis Date  . Medical history non-contributory    Past Surgical History  Procedure Laterality Date  . Myomectomy  2006,2011,2014   Family History: family history includes Cancer in her paternal grandfather; Hypertension in her maternal grandmother; Other in her maternal grandmother and mother. Social History:  reports that she has quit smoking. She has never used smokeless tobacco. She reports that she does not drink alcohol or use illicit drugs.   Prenatal Transfer Tool  Maternal Diabetes: No Genetic Screening: Normal Maternal Ultrasounds/Referrals: Normal several posterior fibroids persist Fetal Ultrasounds or other Referrals:  None Maternal Substance Abuse:  No Significant Maternal Medications:  None Significant Maternal Lab Results:  Lab values include: Group B Strep positive Other Comments:  None  ROS    Last menstrual period 05/14/2013. Exam Physical Exam  Constitutional: She appears well-developed and well-nourished.  HENT:  Head: Normocephalic.  Eyes: Pupils are equal, round, and reactive to light.  Neck: Normal range of motion. Neck supple. No thyromegaly present.  Cardiovascular: Normal rate.   Respiratory: Effort normal.  GI:  Gravid uterus 38 cm with u/s showing 6 lb 3 oz efw at time of 37.w4d visit   Midline vertical lower abdominal scar to umbilicus. Prenatal labs: ABO, Rh: O/POS/-- (04/08 1625) Antibody: NEG (08/24 0913) Rubella: 10.10 (04/08 1625) RPR: NON REAC (08/24 0913)  HBsAg: NEGATIVE (04/08 1625)  HIV: NONREACTIVE (08/24 0913)  GBS: Detected  (10/19 1710)   Assessment/Plan: Pregnancy 39 wk  Prior myomectomy x 2 surgeries, both with multiple fibroids removed.  Plan: Primary cesarean section, 02/11/14 at 9:30 am.   [name] 02/02/2014, 8:16 PM

## 2014-02-03 ENCOUNTER — Encounter (HOSPITAL_COMMUNITY): Payer: Self-pay | Admitting: Pharmacist

## 2014-02-08 ENCOUNTER — Ambulatory Visit (INDEPENDENT_AMBULATORY_CARE_PROVIDER_SITE_OTHER): Payer: BC Managed Care – PPO | Admitting: Obstetrics & Gynecology

## 2014-02-08 ENCOUNTER — Encounter: Payer: Self-pay | Admitting: Obstetrics & Gynecology

## 2014-02-08 VITALS — BP 132/90 | Wt 198.0 lb

## 2014-02-08 DIAGNOSIS — Z1389 Encounter for screening for other disorder: Secondary | ICD-10-CM

## 2014-02-08 DIAGNOSIS — Z3403 Encounter for supervision of normal first pregnancy, third trimester: Secondary | ICD-10-CM

## 2014-02-08 DIAGNOSIS — Z331 Pregnant state, incidental: Secondary | ICD-10-CM

## 2014-02-08 NOTE — Progress Notes (Signed)
G1P0 [redacted]w[redacted]d Estimated Date of Delivery: 02/18/14  Blood pressure 132/90, weight 198 lb (89.812 kg), last menstrual period 05/14/2013.   BP weight and urine results all reviewed and noted.  Please refer to the obstetrical flow sheet for the fundal height and fetal heart rate documentation:  Patient reports good fetal movement, denies any bleeding and no rupture of membranes symptoms or regular contractions. Patient is without complaints. All questions were answered.  Plan:  Continued routine obstetrical care,   Follow up in 02/18/2014 weeks for OB appointment, post op with JVF

## 2014-02-09 ENCOUNTER — Encounter (HOSPITAL_COMMUNITY)
Admission: RE | Admit: 2014-02-09 | Discharge: 2014-02-09 | Disposition: A | Discharge status: Home / Self Care | Payer: BC Managed Care – PPO | Source: Ambulatory Visit | Attending: Obstetrics and Gynecology | Admitting: Obstetrics and Gynecology

## 2014-02-09 ENCOUNTER — Encounter (HOSPITAL_COMMUNITY): Payer: Self-pay

## 2014-02-09 VITALS — BP 121/82 | HR 70 | Temp 97.6°F | Resp 16 | Ht 64.0 in | Wt 200.0 lb

## 2014-02-09 DIAGNOSIS — Z01812 Encounter for preprocedural laboratory examination: Secondary | ICD-10-CM | POA: Insufficient documentation

## 2014-02-09 DIAGNOSIS — Z3403 Encounter for supervision of normal first pregnancy, third trimester: Secondary | ICD-10-CM

## 2014-02-09 LAB — CBC
HCT: 36.4 % (ref 36.0–46.0)
HEMOGLOBIN: 12.7 g/dL (ref 12.0–15.0)
MCH: 31.3 pg (ref 26.0–34.0)
MCHC: 34.9 g/dL (ref 30.0–36.0)
MCV: 89.7 fL (ref 78.0–100.0)
Platelets: 173 10*3/uL (ref 150–400)
RBC: 4.06 MIL/uL (ref 3.87–5.11)
RDW: 13.7 % (ref 11.5–15.5)
WBC: 5.8 10*3/uL (ref 4.0–10.5)

## 2014-02-09 LAB — URINALYSIS, ROUTINE W REFLEX MICROSCOPIC
BILIRUBIN URINE: NEGATIVE
Glucose, UA: NEGATIVE mg/dL
Hgb urine dipstick: NEGATIVE
Ketones, ur: NEGATIVE mg/dL
Leukocytes, UA: NEGATIVE
NITRITE: NEGATIVE
PH: 7 (ref 5.0–8.0)
Protein, ur: NEGATIVE mg/dL
SPECIFIC GRAVITY, URINE: 1.015 (ref 1.005–1.030)
UROBILINOGEN UA: 0.2 mg/dL (ref 0.0–1.0)

## 2014-02-09 LAB — TYPE AND SCREEN
ABO/RH(D): O POS
Antibody Screen: NEGATIVE

## 2014-02-09 LAB — ABO/RH: ABO/RH(D): O POS

## 2014-02-09 NOTE — Patient Instructions (Addendum)
Your procedure is scheduled on:02/11/14  Enter through the Main Entrance at :[place] up desk phone and dial [phone] and inform us of your arrival.  Please call [phone] if you have any problems the morning of surgery.  Remember: Do not eat food after midnight: Wed. Clear liquids are ok until:5am   You may brush your teeth the morning of surgery.  DO NOT wear jewelry, eye make-up, lipstick,body lotion, or dark fingernail polish.  (Polished toes are ok) You may wear deodorant.  If you are to be admitted after surgery, leave suitcase in car until your room has been assigned. Patients discharged on the day of surgery will not be allowed to drive home. Wear loose fitting, comfortable clothes for your ride home.

## 2014-02-10 LAB — RPR

## 2014-02-11 ENCOUNTER — Inpatient Hospital Stay (HOSPITAL_COMMUNITY)
Admission: RE | Admit: 2014-02-11 | Discharge: 2014-02-13 | DRG: 766 | Disposition: A | Discharge status: Home / Self Care | Payer: BC Managed Care – PPO | Source: Ambulatory Visit | Attending: Obstetrics and Gynecology | Admitting: Obstetrics and Gynecology

## 2014-02-11 ENCOUNTER — Encounter (HOSPITAL_COMMUNITY)
Admission: RE | Disposition: A | Discharge status: Home / Self Care | Payer: Self-pay | Source: Ambulatory Visit | Attending: Obstetrics and Gynecology

## 2014-02-11 ENCOUNTER — Inpatient Hospital Stay (HOSPITAL_COMMUNITY): Payer: BC Managed Care – PPO | Admitting: Anesthesiology

## 2014-02-11 ENCOUNTER — Encounter (HOSPITAL_COMMUNITY): Payer: Self-pay | Admitting: Emergency Medicine

## 2014-02-11 DIAGNOSIS — O3413 Maternal care for benign tumor of corpus uteri, third trimester: Secondary | ICD-10-CM | POA: Diagnosis present

## 2014-02-11 DIAGNOSIS — Z3A39 39 weeks gestation of pregnancy: Secondary | ICD-10-CM | POA: Diagnosis present

## 2014-02-11 DIAGNOSIS — Z3403 Encounter for supervision of normal first pregnancy, third trimester: Secondary | ICD-10-CM

## 2014-02-11 DIAGNOSIS — Z87891 Personal history of nicotine dependence: Secondary | ICD-10-CM | POA: Diagnosis not present

## 2014-02-11 DIAGNOSIS — O3429 Maternal care due to uterine scar from other previous surgery: Secondary | ICD-10-CM

## 2014-02-11 DIAGNOSIS — Z98891 History of uterine scar from previous surgery: Secondary | ICD-10-CM

## 2014-02-11 DIAGNOSIS — O99824 Streptococcus B carrier state complicating childbirth: Secondary | ICD-10-CM | POA: Diagnosis present

## 2014-02-11 DIAGNOSIS — Z8249 Family history of ischemic heart disease and other diseases of the circulatory system: Secondary | ICD-10-CM | POA: Diagnosis not present

## 2014-02-11 DIAGNOSIS — D259 Leiomyoma of uterus, unspecified: Secondary | ICD-10-CM

## 2014-02-11 SURGERY — Surgical Case
Anesthesia: Spinal | Site: Abdomen

## 2014-02-11 MED ORDER — ZOLPIDEM TARTRATE 5 MG PO TABS: 5.0000 mg | ORAL_TABLET | Freq: Every evening | ORAL | Status: DC | PRN

## 2014-02-11 MED ORDER — HYDROMORPHONE HCL 1 MG/ML IJ SOLN
0.2500 mg | INTRAMUSCULAR | Status: DC | PRN
  Administered 2014-02-11: 0.5 mg via INTRAVENOUS

## 2014-02-11 MED ORDER — OXYCODONE-ACETAMINOPHEN 5-325 MG PO TABS
1.0000 | ORAL_TABLET | ORAL | Status: DC | PRN
Start: 2014-02-11 — End: 2014-02-11

## 2014-02-11 MED ORDER — ONDANSETRON HCL 4 MG PO TABS: 4.0000 mg | ORAL_TABLET | ORAL | Status: DC | PRN

## 2014-02-11 MED ORDER — CEFAZOLIN SODIUM-DEXTROSE 2-3 GM-% IV SOLR
2.0000 g | INTRAVENOUS | Status: AC
  Administered 2014-02-11: 2 g via INTRAVENOUS

## 2014-02-11 MED ORDER — LANOLIN HYDROUS EX OINT: 1.0000 "application " | TOPICAL_OINTMENT | CUTANEOUS | Status: DC | PRN

## 2014-02-11 MED ORDER — OXYCODONE-ACETAMINOPHEN 5-325 MG PO TABS
2.0000 | ORAL_TABLET | ORAL | Status: DC | PRN
Start: 2014-02-11 — End: 2014-02-11

## 2014-02-11 MED ORDER — OXYTOCIN 40 UNITS IN LACTATED RINGERS INFUSION - SIMPLE MED: 62.5000 mL/h | INTRAVENOUS | Status: DC

## 2014-02-11 MED ORDER — LACTATED RINGERS IV SOLN
INTRAVENOUS | Status: DC
  Administered 2014-02-11 (×4): via INTRAVENOUS

## 2014-02-11 MED ORDER — CEFAZOLIN SODIUM-DEXTROSE 2-3 GM-% IV SOLR
INTRAVENOUS | Status: AC
  Filled 2014-02-11: qty 50

## 2014-02-11 MED ORDER — SIMETHICONE 80 MG PO CHEW
80.0000 mg | CHEWABLE_TABLET | ORAL | Status: DC
  Administered 2014-02-12 (×2): 80 mg via ORAL
  Filled 2014-02-11: qty 1

## 2014-02-11 MED ORDER — ONDANSETRON HCL 4 MG/2ML IJ SOLN: 4.0000 mg | Freq: Three times a day (TID) | INTRAMUSCULAR | Status: DC | PRN

## 2014-02-11 MED ORDER — SODIUM CHLORIDE 0.9 % IJ SOLN: 3.0000 mL | INTRAMUSCULAR | Status: DC | PRN

## 2014-02-11 MED ORDER — WITCH HAZEL-GLYCERIN EX PADS
1.0000 | MEDICATED_PAD | CUTANEOUS | Status: DC | PRN
Start: 2014-02-11 — End: 2014-02-13

## 2014-02-11 MED ORDER — SCOPOLAMINE 1 MG/3DAYS TD PT72
1.0000 | MEDICATED_PATCH | Freq: Once | TRANSDERMAL | Status: DC
  Administered 2014-02-11: 1.5 mg via TRANSDERMAL

## 2014-02-11 MED ORDER — DIPHENHYDRAMINE HCL 50 MG/ML IJ SOLN: 12.5000 mg | INTRAMUSCULAR | Status: DC | PRN

## 2014-02-11 MED ORDER — PRENATAL MULTIVITAMIN CH: 1.0000 | ORAL_TABLET | Freq: Every day | ORAL | Status: DC

## 2014-02-11 MED ORDER — SIMETHICONE 80 MG PO CHEW
80.0000 mg | CHEWABLE_TABLET | Freq: Three times a day (TID) | ORAL | Status: DC
  Administered 2014-02-11 – 2014-02-13 (×4): 80 mg via ORAL
  Filled 2014-02-11 (×3): qty 1

## 2014-02-11 MED ORDER — ONDANSETRON HCL 4 MG/2ML IJ SOLN: 4.0000 mg | INTRAMUSCULAR | Status: DC | PRN

## 2014-02-11 MED ORDER — FENTANYL CITRATE 0.05 MG/ML IJ SOLN
INTRAMUSCULAR | Status: DC | PRN
  Administered 2014-02-11: 25 ug via INTRATHECAL

## 2014-02-11 MED ORDER — NALBUPHINE HCL 10 MG/ML IJ SOLN: 5.0000 mg | Freq: Once | INTRAMUSCULAR | Status: DC | PRN

## 2014-02-11 MED ORDER — LACTATED RINGERS IV SOLN
INTRAVENOUS | Status: DC | PRN
  Administered 2014-02-11: 10:00:00 via INTRAVENOUS

## 2014-02-11 MED ORDER — MEPERIDINE HCL 25 MG/ML IJ SOLN: 6.2500 mg | INTRAMUSCULAR | Status: DC | PRN

## 2014-02-11 MED ORDER — MENTHOL 3 MG MT LOZG: 1.0000 | LOZENGE | OROMUCOSAL | Status: DC | PRN

## 2014-02-11 MED ORDER — TETANUS-DIPHTH-ACELL PERTUSSIS 5-2.5-18.5 LF-MCG/0.5 IM SUSP: 0.5000 mL | Freq: Once | INTRAMUSCULAR | Status: DC

## 2014-02-11 MED ORDER — DIPHENHYDRAMINE HCL 25 MG PO CAPS: 25.0000 mg | ORAL_CAPSULE | Freq: Four times a day (QID) | ORAL | Status: DC | PRN

## 2014-02-11 MED ORDER — SIMETHICONE 80 MG PO CHEW: 80.0000 mg | CHEWABLE_TABLET | Freq: Three times a day (TID) | ORAL | Status: DC

## 2014-02-11 MED ORDER — ACETAMINOPHEN 160 MG/5ML PO SOLN: 960.0000 mg | Freq: Four times a day (QID) | ORAL | Status: DC | PRN

## 2014-02-11 MED ORDER — NALOXONE HCL 0.4 MG/ML IJ SOLN: 0.4000 mg | INTRAMUSCULAR | Status: DC | PRN

## 2014-02-11 MED ORDER — OXYTOCIN 10 UNIT/ML IJ SOLN
INTRAMUSCULAR | Status: AC
  Filled 2014-02-11: qty 4

## 2014-02-11 MED ORDER — PHENYLEPHRINE 8 MG IN D5W 100 ML (0.08MG/ML) PREMIX OPTIME
INJECTION | INTRAVENOUS | Status: DC | PRN
  Administered 2014-02-11: 60 ug/min via INTRAVENOUS

## 2014-02-11 MED ORDER — KETOROLAC TROMETHAMINE 30 MG/ML IJ SOLN: 30.0000 mg | Freq: Four times a day (QID) | INTRAMUSCULAR | Status: DC | PRN

## 2014-02-11 MED ORDER — ONDANSETRON HCL 4 MG/2ML IJ SOLN
INTRAMUSCULAR | Status: AC
  Filled 2014-02-11: qty 2

## 2014-02-11 MED ORDER — CEFAZOLIN SODIUM-DEXTROSE 2-3 GM-% IV SOLR: 2.0000 g | INTRAVENOUS | Status: DC

## 2014-02-11 MED ORDER — MORPHINE SULFATE (PF) 0.5 MG/ML IJ SOLN
INTRAMUSCULAR | Status: DC | PRN
  Administered 2014-02-11: .1 mg via INTRATHECAL

## 2014-02-11 MED ORDER — DIBUCAINE 1 % RE OINT: 1.0000 "application " | TOPICAL_OINTMENT | RECTAL | Status: DC | PRN

## 2014-02-11 MED ORDER — FENTANYL CITRATE 0.05 MG/ML IJ SOLN
INTRAMUSCULAR | Status: AC
  Filled 2014-02-11: qty 2

## 2014-02-11 MED ORDER — IBUPROFEN 600 MG PO TABS
600.0000 mg | ORAL_TABLET | Freq: Four times a day (QID) | ORAL | Status: DC
  Administered 2014-02-11 – 2014-02-13 (×8): 600 mg via ORAL
  Filled 2014-02-11 (×6): qty 1

## 2014-02-11 MED ORDER — NALOXONE HCL 1 MG/ML IJ SOLN
1.0000 ug/kg/h | INTRAVENOUS | Status: DC | PRN
  Filled 2014-02-11: qty 2

## 2014-02-11 MED ORDER — SIMETHICONE 80 MG PO CHEW: 80.0000 mg | CHEWABLE_TABLET | ORAL | Status: DC | PRN

## 2014-02-11 MED ORDER — OXYTOCIN 40 UNITS IN LACTATED RINGERS INFUSION - SIMPLE MED
INTRAVENOUS | Status: DC | PRN
  Administered 2014-02-11: 40 [IU] via INTRAVENOUS

## 2014-02-11 MED ORDER — NALBUPHINE HCL 10 MG/ML IJ SOLN: 5.0000 mg | INTRAMUSCULAR | Status: DC | PRN

## 2014-02-11 MED ORDER — OXYTOCIN 40 UNITS IN LACTATED RINGERS INFUSION - SIMPLE MED: 62.5000 mL/h | INTRAVENOUS | Status: AC

## 2014-02-11 MED ORDER — OXYCODONE-ACETAMINOPHEN 5-325 MG PO TABS: 1.0000 | ORAL_TABLET | ORAL | Status: DC | PRN

## 2014-02-11 MED ORDER — SENNOSIDES-DOCUSATE SODIUM 8.6-50 MG PO TABS
2.0000 | ORAL_TABLET | ORAL | Status: DC
  Administered 2014-02-12 (×2): 2 via ORAL
  Filled 2014-02-11: qty 2

## 2014-02-11 MED ORDER — SCOPOLAMINE 1 MG/3DAYS TD PT72
MEDICATED_PATCH | TRANSDERMAL | Status: AC
  Administered 2014-02-11: 1.5 mg via TRANSDERMAL
  Filled 2014-02-11: qty 1

## 2014-02-11 MED ORDER — KETOROLAC TROMETHAMINE 30 MG/ML IJ SOLN
30.0000 mg | Freq: Four times a day (QID) | INTRAMUSCULAR | Status: DC | PRN
  Administered 2014-02-11: 30 mg via INTRAVENOUS

## 2014-02-11 MED ORDER — IBUPROFEN 600 MG PO TABS: 600.0000 mg | ORAL_TABLET | Freq: Four times a day (QID) | ORAL | Status: DC

## 2014-02-11 MED ORDER — DIPHENHYDRAMINE HCL 25 MG PO CAPS: 25.0000 mg | ORAL_CAPSULE | ORAL | Status: DC | PRN

## 2014-02-11 MED ORDER — SIMETHICONE 80 MG PO CHEW
80.0000 mg | CHEWABLE_TABLET | ORAL | Status: DC | PRN
  Administered 2014-02-13: 80 mg via ORAL
  Filled 2014-02-11: qty 1

## 2014-02-11 MED ORDER — KETOROLAC TROMETHAMINE 30 MG/ML IJ SOLN
INTRAMUSCULAR | Status: AC
  Administered 2014-02-11: 30 mg via INTRAVENOUS
  Filled 2014-02-11: qty 1

## 2014-02-11 MED ORDER — SCOPOLAMINE 1 MG/3DAYS TD PT72
1.0000 | MEDICATED_PATCH | Freq: Once | TRANSDERMAL | Status: DC
  Filled 2014-02-11: qty 1

## 2014-02-11 MED ORDER — LACTATED RINGERS IV SOLN
INTRAVENOUS | Status: DC
  Administered 2014-02-11 – 2014-02-12 (×2): via INTRAVENOUS

## 2014-02-11 MED ORDER — SENNOSIDES-DOCUSATE SODIUM 8.6-50 MG PO TABS: 2.0000 | ORAL_TABLET | ORAL | Status: DC

## 2014-02-11 MED ORDER — HYDROMORPHONE HCL 1 MG/ML IJ SOLN
INTRAMUSCULAR | Status: AC
  Filled 2014-02-11: qty 1

## 2014-02-11 MED ORDER — MORPHINE SULFATE 0.5 MG/ML IJ SOLN
INTRAMUSCULAR | Status: AC
  Filled 2014-02-11: qty 10

## 2014-02-11 MED ORDER — LACTATED RINGERS IV SOLN: INTRAVENOUS | Status: DC

## 2014-02-11 MED ORDER — PHENYLEPHRINE HCL 10 MG/ML IJ SOLN
INTRAMUSCULAR | Status: AC
  Filled 2014-02-11: qty 1

## 2014-02-11 MED ORDER — OXYCODONE-ACETAMINOPHEN 5-325 MG PO TABS: 2.0000 | ORAL_TABLET | ORAL | Status: DC | PRN

## 2014-02-11 MED ORDER — SIMETHICONE 80 MG PO CHEW: 80.0000 mg | CHEWABLE_TABLET | ORAL | Status: DC

## 2014-02-11 MED ORDER — WITCH HAZEL-GLYCERIN EX PADS: 1.0000 "application " | MEDICATED_PAD | CUTANEOUS | Status: DC | PRN

## 2014-02-11 MED ORDER — PRENATAL MULTIVITAMIN CH
1.0000 | ORAL_TABLET | Freq: Every day | ORAL | Status: DC
  Administered 2014-02-11 – 2014-02-13 (×2): 1 via ORAL
  Filled 2014-02-11 (×2): qty 1

## 2014-02-11 MED ORDER — ONDANSETRON HCL 4 MG/2ML IJ SOLN
INTRAMUSCULAR | Status: DC | PRN
  Administered 2014-02-11: 4 mg via INTRAVENOUS

## 2014-02-11 SURGICAL SUPPLY — 31 items
BARRIER ADHS 3X4 INTERCEED (GAUZE/BANDAGES/DRESSINGS) ×2 IMPLANT
BENZOIN TINCTURE PRP APPL 2/3 (GAUZE/BANDAGES/DRESSINGS) ×2 IMPLANT
CLAMP CORD UMBIL (MISCELLANEOUS) ×2 IMPLANT
CLOTH BEACON ORANGE TIMEOUT ST (SAFETY) ×2 IMPLANT
DRAPE SHEET LG 3/4 BI-LAMINATE (DRAPES) ×2 IMPLANT
DRSG OPSITE POSTOP 4X10 (GAUZE/BANDAGES/DRESSINGS) ×2 IMPLANT
DURAPREP 26ML APPLICATOR (WOUND CARE) ×2 IMPLANT
ELECT REM PT RETURN 9FT ADLT (ELECTROSURGICAL) ×2
ELECTRODE REM PT RTRN 9FT ADLT (ELECTROSURGICAL) ×1 IMPLANT
GLOVE BIO SURGEON ST LM GN SZ9 (GLOVE) ×2 IMPLANT
GLOVE BIOGEL PI IND STRL 9 (GLOVE) ×1 IMPLANT
GLOVE BIOGEL PI INDICATOR 9 (GLOVE) ×1
GOWN STRL REUS W/TWL 2XL LVL3 (GOWN DISPOSABLE) ×2 IMPLANT
GOWN STRL REUS W/TWL LRG LVL3 (GOWN DISPOSABLE) ×2 IMPLANT
NS IRRIG 1000ML POUR BTL (IV SOLUTION) ×2 IMPLANT
PACK C SECTION WH (CUSTOM PROCEDURE TRAY) ×2 IMPLANT
PAD OB MATERNITY 4.3X12.25 (PERSONAL CARE ITEMS) ×2 IMPLANT
RTRCTR C-SECT PINK 25CM LRG (MISCELLANEOUS) ×2 IMPLANT
SPONGE LAP 18X18 X RAY DECT (DISPOSABLE) ×2 IMPLANT
STRIP CLOSURE SKIN 1/2X4 (GAUZE/BANDAGES/DRESSINGS) ×2 IMPLANT
SUT MNCRL 0 VIOLET CTX 36 (SUTURE) ×3 IMPLANT
SUT MONOCRYL 0 CTX 36 (SUTURE) ×3
SUT PDS AB 0 CTX 36 PDP370T (SUTURE) ×2 IMPLANT
SUT VIC AB 0 CT1 27 (SUTURE) ×2
SUT VIC AB 0 CT1 27XBRD ANBCTR (SUTURE) ×1 IMPLANT
SUT VIC AB 2-0 CT1 27 (SUTURE) ×3
SUT VIC AB 2-0 CT1 TAPERPNT 27 (SUTURE) ×3 IMPLANT
SUT VIC AB 4-0 KS 27 (SUTURE) ×2 IMPLANT
TOWEL OR 17X24 6PK STRL BLUE (TOWEL DISPOSABLE) ×2 IMPLANT
TRAY FOLEY CATH 14FR (SET/KITS/TRAYS/PACK) ×2 IMPLANT
WATER STERILE IRR 1000ML POUR (IV SOLUTION) ×2 IMPLANT

## 2014-02-11 NOTE — Lactation Note (Signed)
This note was copied from the chart of Julie Danene Montijo. Lactation Consultation Note  P1, Parents attended breastfeeding classes. Reviewed hand expressed and prepumped with hand pump to evert nipple. Baby just bathed.  Baby cueing. Baby has tight lingual frenulum. Attempted latching in football hold and baby would suck but would not sustain latch. Repostioned baby to laid back position and baby latched for 15 min.  Sucks and swallows observed. Reviewed cluster feeding.  Mom encouraged to feed baby 8-12 times/24 hours and with feeding cues.  Mom made aware of O/P services, breastfeeding support groups, community resources, and our phone # for post-discharge questions.    Patient Name: Julie Robbins [id number] Date: 02/11/2014 Reason for consult: Initial assessment   Maternal Data Has patient been taught Hand Expression?: Yes  Feeding Feeding Type: Breast Fed  LATCH Score/Interventions Latch: Repeated attempts needed to sustain latch, nipple held in mouth throughout feeding, stimulation needed to elicit sucking reflex. Intervention(s): Adjust position;Assist with latch;Breast massage;Breast compression  Audible Swallowing: A few with stimulation  Type of Nipple: Everted at rest and after stimulation  Comfort (Breast/Nipple): Soft / non-tender     Hold (Positioning): Assistance needed to correctly position infant at breast and maintain latch.  LATCH Score: 7  Lactation Tools Discussed/Used     Consult Status Consult Status: Follow-up Date: 02/12/14 Follow-up type: In-patient    Vivianne Master [facility] 02/11/2014, 10:26 PM

## 2014-02-11 NOTE — Progress Notes (Signed)
Patient was referred for history of depression/anxiety. * Referral screened out by Clinical Social Worker because none of the following criteria appear to apply: ~ History of anxiety/depression during this pregnancy, or of post-partum depression. ~ Diagnosis of anxiety and/or depression within last 3 years ~ History of depression due to pregnancy loss/loss of child OR * Patient's symptoms currently being treated with medication and/or therapy. Please contact the Clinical Social Worker if needs arise, or if patient requests.  MOB's chart states hx of "situational" Depression in 2010.

## 2014-02-11 NOTE — Brief Op Note (Signed)
02/11/2014  11:03 AM  PATIENT:  Julie Robbins  33 y.o. female  PRE-OPERATIVE DIAGNOSIS:  HISTORY OF MYOMYECTOMIES pregnancy 39 wk   POST-OPERATIVE DIAGNOSIS:  HISTORY OF MYOMYECTOMIES pregnancy 39 wk  PROCEDURE:  Procedure(s): CESAREAN SECTION (N/A) primary low transverse  SURGEON:  Surgeon(s) and Role:    * [name], MD - Primary    * Marita [place] Prudencio Pair, MD - Fellow  PHYSICIAN ASSISTANT:   ASSISTANTS: none   ANESTHESIA:   spinal  EBL:  Total I/O In: 3500 [I.V.:3500] Out: 2297 [Urine:650; Blood:1200]  BLOOD ADMINISTERED:none  DRAINS: Urinary Catheter (Foley)   LOCAL MEDICATIONS USED:  NONE  SPECIMEN:  Source of Specimen:  placenta to L&D  DISPOSITION OF SPECIMEN:  N/A  COUNTS:  YES  TOURNIQUET:  * No tourniquets in log *  DICTATION: .Dragon Dictation  PLAN OF CARE: Admit to inpatient   PATIENT DISPOSITION:  PACU - hemodynamically stable.   Delay start of Pharmacological VTE agent (>24hrs) due to surgical blood loss or risk of bleeding: not applicable  Details of procedure: Patient was taken operating room prepped and draped, timeout conducted and confirmed by surgical team, with Ancef administered 2 g intravenous. Midline vertical lower abdominal incision was performed through the old cicatrix for distance of approximately 18 cm, with sharp dissection and the fascia and using into the anterior peritoneal cavity. There was lots of thin filmy adhesions to the anterior abdominal wall but no significant dense adhesions. Indications can be taken down and the bladder pushed inferiorly. Alexis wound retractor was placed in position. Transverse uterine incision was made with sharp dissection,, extended laterally using index finger traction. The myometrium was extremely thick and the area entry, at least 3 centimeters in thickness. Fetal vertex was rotated into the incision and delivered with fundal pressure.cord was clamped and the uterus responded to Pitocin  withexpulsion of the placenta. Membranes were extracted from lower uterine segment and the uterus was otherwise clean. Palpation of the uterine fundus indicated were no significant adhesions but there were multiple fibroids throughout the myometrium. The uterus was then closed in a running locking first layer occupying approximately half the thickness of the myometrium, sewing from each side to the midline. A second layer was a continuous running suture line also of 0 Monocryl, reapproximated without difficulty. The third layer was required to close the serosal surface on the right one half of the incision. This was satisfactory. Inspected and was confirmed as intact. Foley bulb could be pulled up in the abdominal bladder and there was no disruption. Urine was clear. A small piece of Seprafilm was placed over the incision of the oral uterus. The anterior peritoneum was then closed with continuous running 2-0 Vicryl fashion was then used using 0 PDS single-layer running fashion good hemostasis. Subcutaneous fatty tissues was closed with a running 2-0 Vicryl and subcuticular 4-0 Vicryl on a Keith needle you was used to close the skin. Uterus was palpable postoperative U +4, due to the densely enlarged uterine body, from the fibroids. C pediatrician notes for information on the baby The baby which did well.

## 2014-02-11 NOTE — Transfer of Care (Signed)
Immediate Anesthesia Transfer of Care Note  Patient: Julie Robbins  Procedure(s) Performed: Procedure(s): CESAREAN SECTION (N/A)  Patient Location: PACU  Anesthesia Type:Spinal  Level of Consciousness: awake, alert , oriented and patient cooperative  Airway & Oxygen Therapy: Patient Spontanous Breathing  Post-op Assessment: Report given to PACU RN and Post -op Vital signs reviewed and stable  Post vital signs: Reviewed and stable  Complications: No apparent anesthesia complications

## 2014-02-11 NOTE — Anesthesia Postprocedure Evaluation (Signed)
Anesthesia Post Note  Patient: Julie Robbins  Procedure(s) Performed: Procedure(s) (LRB): CESAREAN SECTION (N/A)  Anesthesia type: Spinal  Patient location: Mother/Baby  Post pain: Pain level controlled  Post assessment: Post-op Vital signs reviewed  Last Vitals:  Filed Vitals:   02/11/14 1500  BP: 101/54  Pulse: 52  Temp: 36.6 C  Resp: 16    Post vital signs: Reviewed  Level of consciousness: awake  Complications: No apparent anesthesia complications

## 2014-02-11 NOTE — Anesthesia Preprocedure Evaluation (Signed)
Anesthesia Evaluation  Patient identified by MRN, date of birth, ID band Patient awake    Reviewed: Allergy & Precautions, H&P , Patient's Chart, lab work & pertinent test results  Airway Mallampati: II  TM Distance: >3 FB Neck ROM: full    Dental no notable dental hx.    Pulmonary former smoker,  breath sounds clear to auscultation  Pulmonary exam normal       Cardiovascular Exercise Tolerance: Good Rhythm:regular Rate:Normal     Neuro/Psych    GI/Hepatic   Endo/Other    Renal/GU      Musculoskeletal   Abdominal   Peds  Hematology   Anesthesia Other Findings   Reproductive/Obstetrics                             Anesthesia Physical Anesthesia Plan  ASA: II  Anesthesia Plan: Spinal   Post-op Pain Management:    Induction:   Airway Management Planned:   Additional Equipment:   Intra-op Plan:   Post-operative Plan:   Informed Consent: I have reviewed the patients History and Physical, chart, labs and discussed the procedure including the risks, benefits and alternatives for the proposed anesthesia with the patient or authorized representative who has indicated his/her understanding and acceptance.   Dental Advisory Given  Plan Discussed with: CRNA  Anesthesia Plan Comments: (Lab work confirmed with CRNA in room. Platelets okay. Discussed spinal anesthetic, and patient consents to the procedure:  included risk of possible headache,backache, failed block, allergic reaction, and nerve injury. This patient was asked if she had any questions or concerns before the procedure started. )        Anesthesia Quick Evaluation  

## 2014-02-11 NOTE — Op Note (Signed)
C brief operative note for discharge for details

## 2014-02-11 NOTE — Anesthesia Procedure Notes (Signed)

## 2014-02-11 NOTE — Addendum Note (Signed)
Addendum  created 02/11/14 1630 by Asher Muir, CRNA   Modules edited: Notes Section   Notes Section:  File: [id number]

## 2014-02-11 NOTE — Plan of Care (Signed)
Problem: Phase I Progression Outcomes Goal: Foley catheter patent Outcome: Completed/Met Date Met:  02/11/14 Goal: IS, TCDB as ordered Outcome: Completed/Met Date Met:  02/11/14 Goal: Initial discharge plan identified Outcome: Completed/Met Date Met:  02/11/14

## 2014-02-11 NOTE — Interval H&P Note (Signed)
History and Physical Interval Note:  02/11/2014 9:18 AM  Julie Robbins  has presented today for surgery, with the diagnosis of [name] , pregnancy 39 weeks The various methods of treatment have been discussed with the patient and family. After consideration of risks, benefits and other options for treatment, the patient has consented to  Procedure(s): CESAREAN SECTION (N/A) as a surgical intervention .  The patient's history has been reviewed, patient examined, no change in status, stable for surgery.  I have reviewed the patient's chart and labs.  Questions were answered to the patient's satisfaction.     [name]

## 2014-02-11 NOTE — Anesthesia Postprocedure Evaluation (Signed)
  Anesthesia Post-op Note  Anesthesia Post Note  Patient: Julie Robbins  Procedure(s) Performed: Procedure(s) (LRB): CESAREAN SECTION (N/A)  Anesthesia type: Spinal  Patient location: PACU  Post pain: Pain level controlled  Post assessment: Post-op Vital signs reviewed  Last Vitals:  Filed Vitals:   02/11/14 1200  BP:   Pulse: 55  Temp: 36.4 C  Resp: 17    Post vital signs: Reviewed  Level of consciousness: awake  Complications: No apparent anesthesia complications

## 2014-02-11 NOTE — H&P (View-Only) (Signed)
Julie Robbins is a 33 y.o. female presenting for primary cesarean Section at 39 weeks. She has a history of having had multiple myomectomy x 2, and is not advised to try labor. Pregnancy has been uneventful to date, with estimated fetal weight of 6 lb and 3 oz at u/s 02/01/14. History OB History   Grav Para Term Preterm Abortions TAB SAB Ect Mult Living   1              Past Medical History  Diagnosis Date  . Medical history non-contributory    Past Surgical History  Procedure Laterality Date  . Myomectomy  2006,2011,2014   Family History: family history includes Cancer in her paternal grandfather; Hypertension in her maternal grandmother; Other in her maternal grandmother and mother. Social History:  reports that she has quit smoking. She has never used smokeless tobacco. She reports that she does not drink alcohol or use illicit drugs.   Prenatal Transfer Tool  Maternal Diabetes: No Genetic Screening: Normal Maternal Ultrasounds/Referrals: Normal several posterior fibroids persist Fetal Ultrasounds or other Referrals:  None Maternal Substance Abuse:  No Significant Maternal Medications:  None Significant Maternal Lab Results:  Lab values include: Group B Strep positive Other Comments:  None  ROS    Last menstrual period 05/14/2013. Exam Physical Exam  Constitutional: She appears well-developed and well-nourished.  HENT:  Head: Normocephalic.  Eyes: Pupils are equal, round, and reactive to light.  Neck: Normal range of motion. Neck supple. No thyromegaly present.  Cardiovascular: Normal rate.   Respiratory: Effort normal.  GI:  Gravid uterus 38 cm with u/s showing 6 lb 3 oz efw at time of 37.w4d visit   Midline vertical lower abdominal scar to umbilicus. Prenatal labs: ABO, Rh: O/POS/-- (04/08 1625) Antibody: NEG (08/24 0913) Rubella: 10.10 (04/08 1625) RPR: NON REAC (08/24 0913)  HBsAg: NEGATIVE (04/08 1625)  HIV: NONREACTIVE (08/24 0913)  GBS: Detected  (10/19 1710)   Assessment/Plan: Pregnancy 39 wk  Prior myomectomy x 2 surgeries, both with multiple fibroids removed.  Plan: Primary cesarean section, 02/11/14 at 9:30 am.   [name] 02/02/2014, 8:16 PM

## 2014-02-12 LAB — CBC
HCT: 29.8 % — ABNORMAL LOW (ref 36.0–46.0)
HEMOGLOBIN: 10.5 g/dL — AB (ref 12.0–15.0)
MCH: 31.4 pg (ref 26.0–34.0)
MCHC: 35.2 g/dL (ref 30.0–36.0)
MCV: 89.2 fL (ref 78.0–100.0)
PLATELETS: 139 10*3/uL — AB (ref 150–400)
RBC: 3.34 MIL/uL — AB (ref 3.87–5.11)
RDW: 13.8 % (ref 11.5–15.5)
WBC: 10.4 10*3/uL (ref 4.0–10.5)

## 2014-02-12 LAB — URINALYSIS, ROUTINE W REFLEX MICROSCOPIC
Bilirubin Urine: NEGATIVE
Glucose, UA: NEGATIVE mg/dL
HGB URINE DIPSTICK: NEGATIVE
Ketones, ur: NEGATIVE mg/dL
LEUKOCYTES UA: NEGATIVE
Nitrite: NEGATIVE
PH: 6.5 (ref 5.0–8.0)
Protein, ur: NEGATIVE mg/dL
Specific Gravity, Urine: 1.015 (ref 1.005–1.030)
Urobilinogen, UA: 0.2 mg/dL (ref 0.0–1.0)

## 2014-02-12 LAB — BIRTH TISSUE RECOVERY COLLECTION (PLACENTA DONATION)

## 2014-02-12 LAB — CCBB MATERNAL DONOR DRAW

## 2014-02-12 NOTE — Plan of Care (Signed)
Problem: Phase I Progression Outcomes Goal: Pain controlled with appropriate interventions Outcome: Completed/Met Date Met:  02/12/14 Goal: OOB as tolerated unless otherwise ordered Outcome: Completed/Met Date Met:  02/12/14 Goal: VS, stable, temp < 100.4 degrees F Outcome: Completed/Met Date Met:  02/12/14

## 2014-02-12 NOTE — Plan of Care (Signed)
Problem: Phase I Progression Outcomes Goal: Voiding adequately Outcome: Completed/Met Date Met:  02/12/14  Problem: Phase II Progression Outcomes Goal: Pain controlled on oral analgesia Outcome: Completed/Met Date Met:  02/12/14 Goal: Progress activity as tolerated unless otherwise ordered Outcome: Completed/Met Date Met:  02/12/14 Goal: Afebrile, VS remain stable Outcome: Completed/Met Date Met:  02/12/14 Goal: Incision intact & without signs/symptoms of infection Outcome: Completed/Met Date Met:  02/12/14 Goal: Tolerating diet Outcome: Completed/Met Date Met:  02/12/14  Problem: Discharge Progression Outcomes Goal: Tolerating diet Outcome: Completed/Met Date Met:  02/12/14

## 2014-02-12 NOTE — Lactation Note (Signed)
This note was copied from the chart of Julie Robbins. Lactation Consultation Note  Patient Name: Julie Robbins [id number] Date: 02/12/2014 Reason for consult: Follow-up assessment Baby 29 hours of life. Patient's MBU RN Levada Dy states that baby doesn't appear to be latching deeply enough. Mom asks about baby's tongue and says sometimes when baby latches she can hear baby suckling. Discussed with mom that if she hears baby suckling, baby probably not latched deeply enough. Baby is able to bring tongue across lower gumline. Did discuss with mom that if she starts to experience continuous pain while nursing, she would need to discuss the baby's tongue with her pediatrician. Baby nursing when [facility] entered room. Baby pulled away from breast and nipple slightly pinched. Mom states that she believes shells are helping to evert nipples and baby seems to be latching better. Assisted mom to latch baby in cross cradle position to directly to left breast--without using a NS, baby has a gentle suckle. Demonstrated to mom how to tug chin and flange lower lip outward.  Mom reports increased comfort. Mom states baby has been sucking and thrusting her tongue. Discussed with mom that baby will need time to break this, but to just keep latching. Also discussed letting baby suckle on clean finger prior to nursing. Enc mom to offer lots of STS and nurse with cues, re-latching to sustain a deep latch. Enc mom to ask for assistance with latching as needed.   Maternal Data    Feeding Feeding Type: Breast Fed Length of feed: 5 min  LATCH Score/Interventions Latch: Repeated attempts needed to sustain latch, nipple held in mouth throughout feeding, stimulation needed to elicit sucking reflex. Intervention(s): Adjust position;Assist with latch;Breast compression  Audible Swallowing: A few with stimulation  Type of Nipple: Everted at rest and after stimulation (Mom using shells, baby able to latch and evert  nipple more. )  Comfort (Breast/Nipple): Soft / non-tender     Hold (Positioning): Assistance needed to correctly position infant at breast and maintain latch. Intervention(s): Breastfeeding basics reviewed;Support Pillows;Position options  LATCH Score: 7  Lactation Tools Discussed/Used     Consult Status Consult Status: Follow-up Date: 02/13/14 Follow-up type: In-patient    Inocente Salles 02/12/2014, 3:56 PM

## 2014-02-12 NOTE — Progress Notes (Cosign Needed)
Subjective: Postpartum Day 1 Cesarean Delivery Patient reports tolerating PO. Pain well controlled. Lochia minimal. No nausea, vomiting. Has not had flatus, has not had a BM. Catheter out this morning.   Objective: Vital signs in last 24 hours: Temp:  [97.5 F (36.4 C)-98.9 F (37.2 C)] 98.4 F (36.9 C) (11/06 0630) Pulse Rate:  [50-72] 65 (11/06 0630) Resp:  [15-22] 16 (11/06 0630) BP: (96-113)/(44-72) 108/61 mmHg (11/06 0630) SpO2:  [97 %-100 %] 99 % (11/06 0630)  Physical Exam:  General: alert and cooperative Lochia: appropriate Uterine Fundus: firm Incision: healing well. Minimal blood on bandage.  DVT Evaluation: No evidence of DVT seen on physical exam.   Recent Labs  02/09/14 1615 02/12/14 0555  HGB 12.7 10.5*  HCT 36.4 29.8*    Assessment/Plan: Status post Cesarean section. Doing well postoperatively.  Continue current care.  Discharge home tomorrow. Plans to breast feed. Undecided on birth control.   Bruce Donath 02/12/2014, 9:58 AM

## 2014-02-13 ENCOUNTER — Encounter (HOSPITAL_COMMUNITY): Payer: Self-pay | Admitting: Obstetrics and Gynecology

## 2014-02-13 MED ORDER — OXYCODONE-ACETAMINOPHEN 5-325 MG PO TABS: 1.0000 | ORAL_TABLET | Freq: Four times a day (QID) | ORAL | Status: DC | PRN

## 2014-02-13 NOTE — Discharge Summary (Signed)
Julie Robbins is a 33 y.o. Female who presented for primary cesarean Section at 39 weeks. She has a history of having had multiple myomectomy x 2, and is not advised to try labor. Pregnancy was uneventful w prenatal care at [facility].  Today pt currently feels well. Is breastfeeding. Amublating wo difficulty. +void, +flatus. Pain well controlled. Good appetite. No n/v/d. Obstetric Discharge Summary Reason for Admission: cesarean section Prenatal Procedures: none Intrapartum Procedures: cesarean: low cervical, transverse Postpartum Procedures: none Complications-Operative and Postpartum: none HEMOGLOBIN  Date Value Ref Range Status  02/12/2014 10.5* 12.0 - 15.0 g/dL Final   HCT  Date Value Ref Range Status  02/12/2014 29.8* 36.0 - 46.0 % Final   Delivery: pLTCS wo difficulty.  Physical Exam:  General: alert, cooperative and no distress Lochia: appropriate Uterine Fundus: firm Incision: healing well, no significant drainage, no dehiscence, no significant erythema DVT Evaluation: No evidence of DVT seen on physical exam.  Discharge Diagnoses: Term Pregnancy-delivered  Discharge Information: Date: 02/13/2014 Activity: pelvic rest Diet: routine Medications: Ibuprofen and Percocet Condition: stable Instructions: refer to practice specific booklet Discharge to: home Follow-up Information    Follow up with [facility] In 6 weeks.   Why:  For post-partum follow up   Contact information:   [address] [place] [address] [phone]      Newborn Data: Live born female  Birth Weight: 6 lb 7 oz (2920 g) APGAR: 8, 9  Home with mother.  Collene Gobble 02/13/2014, 7:54 AM

## 2014-02-13 NOTE — Lactation Note (Signed)
This note was copied from the chart of Julie Rosaisela Jamroz. Lactation Consultation Note: Follow up visit with mom before DC. Mom reports that baby has been feeding well but is sliding down to tip of nipple at some feedings. Encouraged to take baby off and relatch. Mom reports that nipples are sore. They look intact- slightly pink. Comfort gels given with instructions for use. To talk with Ped if nipples continue to be sore and baby not able to latch deeply. Mom getting in the shower. Baby asleep- last fed about 1 1/2 hours ago. Mom asking about a pump. Plans to get one from insurance company. Asking about pump rental - but suggested just nursing for the first few Willem Klingensmith. Has manual pump and reports it feels fine. No further questions at present. Reviewed OP appointments are available for assist. Going to see LC at [facility]. To call prn  Patient Name: Julie Robbins BOERQ'S Date: 02/13/2014 Reason for consult: Follow-up assessment   Maternal Data    Feeding    LATCH Score/Interventions                      Lactation Tools Discussed/Used     Consult Status Consult Status: Complete    Truddie Crumble 02/13/2014, 8:20 AM

## 2014-02-18 ENCOUNTER — Encounter: Payer: Self-pay | Admitting: Obstetrics and Gynecology

## 2014-02-18 ENCOUNTER — Ambulatory Visit (INDEPENDENT_AMBULATORY_CARE_PROVIDER_SITE_OTHER): Payer: BC Managed Care – PPO | Admitting: Obstetrics and Gynecology

## 2014-02-18 VITALS — BP 118/76 | Ht 64.0 in | Wt 179.0 lb

## 2014-02-18 DIAGNOSIS — Z98891 History of uterine scar from previous surgery: Secondary | ICD-10-CM

## 2014-02-18 DIAGNOSIS — Z9889 Other specified postprocedural states: Secondary | ICD-10-CM

## 2014-02-18 NOTE — Progress Notes (Signed)
Patient ID: Julie Robbins, female   DOB: 11/29/1980, 33 y.o.   MRN: [id number] Pt here today for post op visit. Pt denies any problems or concerns at this time.

## 2014-02-18 NOTE — Progress Notes (Signed)
Patient ID: Julie Robbins, female   DOB: 1981-02-18, 33 y.o.   MRN: [id number] Subjective:     Julie Robbins is a 33 y.o. female who presents to the clinic 1 weeks status post cesarean section for childbirth.due to hx multiple myomectomies Diet:       regular without difficulty. Bowel function is: normal. Pain:     The patient is not having any pain.  The following portions of the patient's history were reviewed and updated as appropriate: allergies, current medications, past family history, past medical history, past social history, past surgical history and problem list.  Review of Systems Pertinent items are noted in HPI.    Objective:    BP 118/76 mmHg  Ht 5\' 4"  (1.626 m)  Wt 179 lb (81.194 kg)  BMI 30.71 kg/m2  Breastfeeding? Yes General:  alert, cooperative, appears stated age, no distress and mildly obese  Abdomen: soft, bowel sounds active, non-tender  Incision:   healing well, no drainage, no erythema, no hernia, no seroma, no swelling, no dehiscence, incision well approximated dressing removed       Pelvic: Not done   Assessment:   Doing well postoperatively. Operative findings again reviewed. Pathology report discussed.   Plan:   1. Continue any current medications. 2. Wound care discussed. 3. Activity restrictions: no lifting more than 5 pounds pounds 4. Anticipated return to work: 12 weeks. 5. Follow up: 4 weeks for follow-up exam.   This chart was scribed for [name], MD by Donato Schultz, ED Scribe. This patient was seen in Room 2 and the patient's care was started at 10:50 AM.

## 2014-03-22 ENCOUNTER — Encounter: Payer: Self-pay | Admitting: Obstetrics and Gynecology

## 2014-03-22 ENCOUNTER — Ambulatory Visit (INDEPENDENT_AMBULATORY_CARE_PROVIDER_SITE_OTHER): Payer: BC Managed Care – PPO | Admitting: Obstetrics and Gynecology

## 2014-03-22 NOTE — Progress Notes (Signed)
Patient ID: Julie Robbins, female   DOB: [birth year], 33 y.o.   MRN: [id number] Pt here today for postpartum visit and final post op. Pt states that she wants to discuss her bleeding with Dr. Glo Herring, she has not stopped bleeding since delivery and it seems to have gotten heavier the past few days.

## 2014-03-22 NOTE — Progress Notes (Signed)
Subjective:     Julie Robbins is a 33 y.o. female who presents for a postpartum visit. She is 4 week postpartum following a low cervical transverse Cesarean section. I have fully reviewed the prenatal and intrapartum course. The delivery was at 66 gestational weeks. Outcome: primary cesarean section, low transverse incision. Anesthesia: epidural and spinal. Postpartum course has been noted for bleeding off and on. Baby's course has been stable. Baby is feeding by breast. Bleeding moderate lochia. Bowel function is normal. Bladder function is normal. Patient is not sexually active. Contraception method is none. Postpartum depression screening: negative.  The following portions of the patient's history were reviewed and updated as appropriate: current medications, past family history, past medical history, past surgical history and problem list.   Review of Systems Pertinent items are noted in HPI.   Objective:    BP 100/70 mmHg  Ht 5\' 4"  (1.626 m)  Wt 75.297 kg (166 lb)  BMI 28.48 kg/m2  Breastfeeding? Yes  General:  no distress   Breasts:  negative  Lungs:   Heart:    Abdomen: soft, non-tender; bowel sounds normal; no masses,  no organomegaly   Vulva:  normal  Vagina: normal vagina  Cervix:  posterior  Corpus: enlarged, 10 weeks size  Adnexa:  normal adnexa  Rectal Exam: Not performed.        Assessment:     normal  postpartum exam. Pap smear not done at today's visit.   Plan:    1. Contraception: abstinence and condoms 2. 1 yr 3. Follow up in: 122 months or as needed.

## 2015-07-20 DIAGNOSIS — Z6829 Body mass index (BMI) 29.0-29.9, adult: Secondary | ICD-10-CM | POA: Diagnosis not present

## 2015-07-20 DIAGNOSIS — Z0000 Encounter for general adult medical examination without abnormal findings: Secondary | ICD-10-CM | POA: Diagnosis not present

## 2015-07-28 ENCOUNTER — Encounter: Payer: Self-pay | Admitting: Obstetrics and Gynecology

## 2015-07-28 ENCOUNTER — Other Ambulatory Visit: Payer: Self-pay | Admitting: Obstetrics and Gynecology

## 2015-07-28 ENCOUNTER — Other Ambulatory Visit (INDEPENDENT_AMBULATORY_CARE_PROVIDER_SITE_OTHER): Payer: BLUE CROSS/BLUE SHIELD

## 2015-07-28 ENCOUNTER — Ambulatory Visit (INDEPENDENT_AMBULATORY_CARE_PROVIDER_SITE_OTHER): Payer: BLUE CROSS/BLUE SHIELD | Admitting: Obstetrics and Gynecology

## 2015-07-28 VITALS — BP 130/82 | Ht 64.0 in | Wt 163.0 lb

## 2015-07-28 DIAGNOSIS — D259 Leiomyoma of uterus, unspecified: Secondary | ICD-10-CM | POA: Insufficient documentation

## 2015-07-28 DIAGNOSIS — Z9889 Other specified postprocedural states: Secondary | ICD-10-CM | POA: Diagnosis not present

## 2015-07-28 DIAGNOSIS — D251 Intramural leiomyoma of uterus: Secondary | ICD-10-CM | POA: Diagnosis not present

## 2015-07-28 NOTE — Progress Notes (Signed)
   [facility] Visit  @DATE @            Patient name: Julie Robbins MRN [id number]  Date of birth: June 05, 1980  CC & HPI:  Julie Robbins is a 35 y.o. female with a history of uterine fibroid complicating antenatal care, presenting today to discuss the possibility of having another child, given her history of multiple uterine fibroids and myomectomies. Patient states her LMP began 5 days ago, and she is still currently undergoing menses.    ROS:  Review of Systems  All other systems reviewed and are negative.   Pertinent History Reviewed:   Reviewed: Significant for multiple myomectomies Medical         Past Medical History  Diagnosis Date  . Medical history non-contributory                               Surgical Hx:    Past Surgical History  Procedure Laterality Date  . Myomectomy  2006,2011,2014  . Cesarean section N/A 02/11/2014    Procedure: CESAREAN SECTION;  Surgeon: [name], MD;  Location: [facility];  Service: Obstetrics;  Laterality: N/A;   Medications: Reviewed & Updated - see associated section                       Current outpatient prescriptions:  .  cholecalciferol (VITAMIN D) 1000 UNITS tablet, Take 2,000 Units by mouth daily., Disp: , Rfl:  .  Prenatal Vit-Fe Sulfate-FA (PRENATAL VITAMIN PO), Take by mouth daily., Disp: , Rfl:    Social History: Reviewed -  reports that she has quit smoking. She has never used smokeless tobacco.  Objective Findings:  Vitals: Blood pressure 130/82, height 5\' 4"  (1.626 m), weight 163 lb (73.936 kg), last menstrual period 07/23/2015, not currently breastfeeding.  Physical Examination:  Pelvic ultrasound shows stable uterine fibroids, 6 cm largest  Discussed with pt risks and benefits of pregnancy with uterine fibroids. At end of discussion, pt had opportunity to ask questions and has no further questions at this time.   Greater than 50% was spent in counseling and coordination of care with the patient.  Total time greater than: 15 minutes   Assessment & Plan:   A:  1. Stable uterine fibroids, 6 cm largest 2. History of multiple myomectomies  P:  1. Routine plans for conception    By signing my name below, I, Stephania Fragmin, attest that this documentation has been prepared under the direction and in the presence of [name], MD. Electronically Signed: Stephania Fragmin, ED Scribe. 07/28/2015. 11:42 AM.  I personally performed the services described in this documentation, which was SCRIBED in my presence. The recorded information has been reviewed and considered accurate. It has been edited as necessary during review. [name], MD   I personally performed the services described in this documentation, which was SCRIBED in my presence. The recorded information has been reviewed and considered accurate. It has been edited as necessary during review. [name], MD

## 2015-07-28 NOTE — Progress Notes (Signed)
PELVIC US TA/TV: heterogenous anteverted uterus w/mult fibroids,largest fibroids (#1) post fundal subserosal 6.3 x 3.6 x 4.5cn,(#2) lus intramural 2.2 x 2.4 x 2.6 cm, (#3) fundal  intramural 2 x 2 x 2.7cm,EEC 6.2 mm,normal ov's bilat (mobile)

## 2015-12-11 DIAGNOSIS — Z6827 Body mass index (BMI) 27.0-27.9, adult: Secondary | ICD-10-CM | POA: Diagnosis not present

## 2015-12-11 DIAGNOSIS — L03116 Cellulitis of left lower limb: Secondary | ICD-10-CM | POA: Diagnosis not present

## 2016-05-15 ENCOUNTER — Ambulatory Visit (INDEPENDENT_AMBULATORY_CARE_PROVIDER_SITE_OTHER): Payer: BLUE CROSS/BLUE SHIELD | Admitting: Adult Health

## 2016-05-15 ENCOUNTER — Encounter: Payer: Self-pay | Admitting: Adult Health

## 2016-05-15 VITALS — BP 102/64 | HR 72 | Ht 64.0 in | Wt 163.0 lb

## 2016-05-15 DIAGNOSIS — Z3490 Encounter for supervision of normal pregnancy, unspecified, unspecified trimester: Secondary | ICD-10-CM

## 2016-05-15 DIAGNOSIS — Z3201 Encounter for pregnancy test, result positive: Secondary | ICD-10-CM | POA: Diagnosis not present

## 2016-05-15 DIAGNOSIS — O09521 Supervision of elderly multigravida, first trimester: Secondary | ICD-10-CM

## 2016-05-15 DIAGNOSIS — D251 Intramural leiomyoma of uterus: Secondary | ICD-10-CM | POA: Diagnosis not present

## 2016-05-15 DIAGNOSIS — N926 Irregular menstruation, unspecified: Secondary | ICD-10-CM | POA: Diagnosis not present

## 2016-05-15 DIAGNOSIS — O3680X0 Pregnancy with inconclusive fetal viability, not applicable or unspecified: Secondary | ICD-10-CM

## 2016-05-15 DIAGNOSIS — Z9889 Other specified postprocedural states: Secondary | ICD-10-CM

## 2016-05-15 DIAGNOSIS — R5383 Other fatigue: Secondary | ICD-10-CM | POA: Diagnosis not present

## 2016-05-15 DIAGNOSIS — Z98891 History of uterine scar from previous surgery: Secondary | ICD-10-CM

## 2016-05-15 LAB — POCT URINE PREGNANCY: PREG TEST UR: POSITIVE — AB

## 2016-05-15 NOTE — Patient Instructions (Signed)
First Trimester of Pregnancy The first trimester of pregnancy is from week 1 until the end of week 12 (months 1 through 3). A week after a sperm fertilizes an egg, the egg will implant on the wall of the uterus. This embryo will begin to develop into a baby. Genes from you and your partner are forming the baby. The female genes determine whether the baby is a boy or a girl. At 6-8 weeks, the eyes and face are formed, and the heartbeat can be seen on ultrasound. At the end of 12 weeks, all the baby's organs are formed.  Now that you are pregnant, you will want to do everything you can to have a healthy baby. Two of the most important things are to get good prenatal care and to follow your health care provider's instructions. Prenatal care is all the medical care you receive before the baby's birth. This care will help prevent, find, and treat any problems during the pregnancy and childbirth. BODY CHANGES Your body goes through many changes during pregnancy. The changes vary from woman to woman.   You may gain or lose a couple of pounds at first.  You may feel sick to your stomach (nauseous) and throw up (vomit). If the vomiting is uncontrollable, call your health care provider.  You may tire easily.  You may develop headaches that can be relieved by medicines approved by your health care provider.  You may urinate more often. Painful urination may mean you have a bladder infection.  You may develop heartburn as a result of your pregnancy.  You may develop constipation because certain hormones are causing the muscles that push waste through your intestines to slow down.  You may develop hemorrhoids or swollen, bulging veins (varicose veins).  Your breasts may begin to grow larger and become tender. Your nipples may stick out more, and the tissue that surrounds them (areola) may become darker.  Your gums may bleed and may be sensitive to brushing and flossing.  Dark spots or blotches (chloasma,  mask of pregnancy) may develop on your face. This will likely fade after the baby is born.  Your menstrual periods will stop.  You may have a loss of appetite.  You may develop cravings for certain kinds of food.  You may have changes in your emotions from day to day, such as being excited to be pregnant or being concerned that something may go wrong with the pregnancy and baby.  You may have more vivid and strange dreams.  You may have changes in your hair. These can include thickening of your hair, rapid growth, and changes in texture. Some women also have hair loss during or after pregnancy, or hair that feels dry or thin. Your hair will most likely return to normal after your baby is born. WHAT TO EXPECT AT YOUR PRENATAL VISITS During a routine prenatal visit:  You will be weighed to make sure you and the baby are growing normally.  Your blood pressure will be taken.  Your abdomen will be measured to track your baby's growth.  The fetal heartbeat will be listened to starting around week 10 or 12 of your pregnancy.  Test results from any previous visits will be discussed. Your health care provider may ask you:  How you are feeling.  If you are feeling the baby move.  If you have had any abnormal symptoms, such as leaking fluid, bleeding, severe headaches, or abdominal cramping.  If you are using any tobacco products,   including cigarettes, chewing tobacco, and electronic cigarettes.  If you have any questions. Other tests that may be performed during your first trimester include:  Blood tests to find your blood type and to check for the presence of any previous infections. They will also be used to check for low iron levels (anemia) and Rh antibodies. Later in the pregnancy, blood tests for diabetes will be done along with other tests if problems develop.  Urine tests to check for infections, diabetes, or protein in the urine.  An ultrasound to confirm the proper growth  and development of the baby.  An amniocentesis to check for possible genetic problems.  Fetal screens for spina bifida and Down syndrome.  You may need other tests to make sure you and the baby are doing well.  HIV (human immunodeficiency virus) testing. Routine prenatal testing includes screening for HIV, unless you choose not to have this test. HOME CARE INSTRUCTIONS  Medicines   Follow your health care provider's instructions regarding medicine use. Specific medicines may be either safe or unsafe to take during pregnancy.  Take your prenatal vitamins as directed.  If you develop constipation, try taking a stool softener if your health care provider approves. Diet   Eat regular, well-balanced meals. Choose a variety of foods, such as meat or vegetable-based protein, fish, milk and low-fat dairy products, vegetables, fruits, and whole grain breads and cereals. Your health care provider will help you determine the amount of weight gain that is right for you.  Avoid raw meat and uncooked cheese. These carry germs that can cause birth defects in the baby.  Eating four or five small meals rather than three large meals a day may help relieve nausea and vomiting. If you start to feel nauseous, eating a few soda crackers can be helpful. Drinking liquids between meals instead of during meals also seems to help nausea and vomiting.  If you develop constipation, eat more high-fiber foods, such as fresh vegetables or fruit and whole grains. Drink enough fluids to keep your urine clear or pale yellow. Activity and Exercise   Exercise only as directed by your health care provider. Exercising will help you:  Control your weight.  Stay in shape.  Be prepared for labor and delivery.  Experiencing pain or cramping in the lower abdomen or low back is a good sign that you should stop exercising. Check with your health care provider before continuing normal exercises.  Try to avoid standing for  long periods of time. Move your legs often if you must stand in one place for a long time.  Avoid heavy lifting.  Wear low-heeled shoes, and practice good posture.  You may continue to have sex unless your health care provider directs you otherwise. Relief of Pain or Discomfort   Wear a good support bra for breast tenderness.   Take warm sitz baths to soothe any pain or discomfort caused by hemorrhoids. Use hemorrhoid cream if your health care provider approves.   Rest with your legs elevated if you have leg cramps or low back pain.  If you develop varicose veins in your legs, wear support hose. Elevate your feet for 15 minutes, 3-4 times a day. Limit salt in your diet. Prenatal Care   Schedule your prenatal visits by the twelfth week of pregnancy. They are usually scheduled monthly at first, then more often in the last 2 months before delivery.  Write down your questions. Take them to your prenatal visits.  Keep all your prenatal  legs, wear support hose. Elevate your feet for 15 minutes, 3-4 times a day. Limit salt in your diet.  Prenatal Care  · Schedule your prenatal visits by the twelfth week of pregnancy. They are usually scheduled monthly at first, then more often in the last 2 months before delivery.  · Write down your questions. Take them to your prenatal visits.  · Keep all your prenatal visits as directed by your health care provider.  Safety  · Wear your seat belt at all times when driving.  · Make a list of emergency phone numbers, including numbers for family, friends, the hospital, and police and fire departments.  General Tips  · Ask your health care provider for a referral to a local prenatal education class. Begin classes no later than at the beginning of month 6 of your pregnancy.  · Ask for help if you have counseling or nutritional needs during pregnancy. Your health care provider can offer advice or refer you to specialists for help with various needs.  · Do not use hot tubs, steam rooms, or saunas.  · Do not douche or use tampons or scented sanitary pads.  · Do not cross your legs for long periods of time.  · Avoid cat litter boxes and soil used by cats. These carry germs that can cause birth defects in the baby and possibly loss of the fetus by miscarriage or stillbirth.   · Avoid all smoking, herbs, alcohol, and medicines not prescribed by your health care provider. Chemicals in these affect the formation and growth of the baby.  · Do not use any tobacco products, including cigarettes, chewing tobacco, and electronic cigarettes. If you need help quitting, ask your health care provider. You may receive counseling support and other resources to help you quit.  · Schedule a dentist appointment. At home, brush your teeth with a soft toothbrush and be gentle when you floss.  SEEK MEDICAL CARE IF:   · You have dizziness.  · You have mild pelvic cramps, pelvic pressure, or nagging pain in the abdominal area.  · You have persistent nausea, vomiting, or diarrhea.  · You have a bad smelling vaginal discharge.  · You have pain with urination.  · You notice increased swelling in your face, hands, legs, or ankles.  SEEK IMMEDIATE MEDICAL CARE IF:   · You have a fever.  · You are leaking fluid from your vagina.  · You have spotting or bleeding from your vagina.  · You have severe abdominal cramping or pain.  · You have rapid weight gain or loss.  · You vomit blood or material that looks like coffee grounds.  · You are exposed to German measles and have never had them.  · You are exposed to fifth disease or chickenpox.  · You develop a severe headache.  · You have shortness of breath.  · You have any kind of trauma, such as from a fall or a car accident.     This information is not intended to replace advice given to you by your health care provider. Make sure you discuss any questions you have with your health care provider.     Document Released: 03/20/2001 Document Revised: 04/16/2014 Document Reviewed: 02/03/2013  Elsevier Interactive Patient Education ©2017 [employer]

## 2016-05-15 NOTE — Progress Notes (Signed)
Subjective:     Patient ID: Julie Robbins, female   DOB: 1981-02-17, 36 y.o.   MRN: [id number]  HPI Julie Robbins is a 36 year old black female, married in for UPT, has missed a period and had 2+HPT.She is having some nausea and fatigue. She was a prior C section and has a history of fibroids and myomectomy.  Review of Systems +missed period  +nausea +fatigue Reviewed past medical,surgical, social and family history. Reviewed medications and allergies.     Objective:   Physical Exam BP 102/64 (BP Location: Right Arm, Patient Position: Sitting, Cuff Size: Normal)   Pulse 72   Ht 5\' 4"  (1.626 m)   Wt 163 lb (73.9 kg)   LMP 03/28/2016 (Exact Date)   BMI 27.98 kg/m UPT +, about 6+6 weeks by LMP with EDD 01/03/17. Skin warm and dry. Neck: mid line trachea, normal thyroid, good ROM, no lymphadenopathy noted. Lungs: clear to ausculation bilaterally. Cardiovascular: regular rate and rhythm.Abdomen is soft and non tender. PHQ 2 score 0.     Assessment:     1. Positive pregnancy test   2. Pregnancy, unspecified gestational age   33. Elderly multigravida in first trimester   4. Encounter to determine fetal viability of pregnancy, single or unspecified fetus   5. History of cesarean section   6. H/O myomectomy   7. Intramural leiomyoma of uterus       Plan:     Return in 2 weeks for dating [place] and intake Review handout on first trimester Eat often and push fluids

## 2016-05-30 ENCOUNTER — Ambulatory Visit: Payer: BLUE CROSS/BLUE SHIELD | Admitting: *Deleted

## 2016-05-30 ENCOUNTER — Ambulatory Visit (INDEPENDENT_AMBULATORY_CARE_PROVIDER_SITE_OTHER): Payer: BLUE CROSS/BLUE SHIELD

## 2016-05-30 DIAGNOSIS — O3680X0 Pregnancy with inconclusive fetal viability, not applicable or unspecified: Secondary | ICD-10-CM | POA: Diagnosis not present

## 2016-05-30 DIAGNOSIS — Z3A10 10 weeks gestation of pregnancy: Secondary | ICD-10-CM

## 2016-05-30 DIAGNOSIS — O3411 Maternal care for benign tumor of corpus uteri, first trimester: Secondary | ICD-10-CM

## 2016-05-30 NOTE — Progress Notes (Signed)
[place] 9+6 wks,single IUP w/ys,pos fht 170 bpm,crl 30.4 mm,normal ov's bilat,mult.fibroids (#1) post fundal 4.4 x 4.1 x 4.6 cm,(#2) LUS 5.2  X 4.2 x 4.2 cm,(#3) fundal 2.9 x 2.3 x 3 cm,EDD 12/27/2016

## 2016-06-18 ENCOUNTER — Encounter: Payer: BLUE CROSS/BLUE SHIELD | Admitting: Women's Health

## 2016-06-18 ENCOUNTER — Ambulatory Visit: Payer: BLUE CROSS/BLUE SHIELD | Admitting: *Deleted

## 2016-06-27 ENCOUNTER — Encounter: Payer: Self-pay | Admitting: Women's Health

## 2016-06-27 ENCOUNTER — Ambulatory Visit (INDEPENDENT_AMBULATORY_CARE_PROVIDER_SITE_OTHER): Payer: BLUE CROSS/BLUE SHIELD | Admitting: Women's Health

## 2016-06-27 ENCOUNTER — Other Ambulatory Visit (HOSPITAL_COMMUNITY)
Admission: RE | Admit: 2016-06-27 | Discharge: 2016-06-27 | Disposition: A | Discharge status: Home / Self Care | Payer: BLUE CROSS/BLUE SHIELD | Source: Ambulatory Visit | Attending: Obstetrics & Gynecology | Admitting: Obstetrics & Gynecology

## 2016-06-27 ENCOUNTER — Ambulatory Visit: Payer: BLUE CROSS/BLUE SHIELD | Admitting: *Deleted

## 2016-06-27 VITALS — BP 100/78 | HR 82 | Wt 162.0 lb

## 2016-06-27 DIAGNOSIS — Z1389 Encounter for screening for other disorder: Secondary | ICD-10-CM

## 2016-06-27 DIAGNOSIS — Z3482 Encounter for supervision of other normal pregnancy, second trimester: Secondary | ICD-10-CM | POA: Diagnosis not present

## 2016-06-27 DIAGNOSIS — Z9889 Other specified postprocedural states: Secondary | ICD-10-CM

## 2016-06-27 DIAGNOSIS — Z98891 History of uterine scar from previous surgery: Secondary | ICD-10-CM

## 2016-06-27 DIAGNOSIS — Z331 Pregnant state, incidental: Secondary | ICD-10-CM

## 2016-06-27 DIAGNOSIS — Z3A13 13 weeks gestation of pregnancy: Secondary | ICD-10-CM | POA: Insufficient documentation

## 2016-06-27 DIAGNOSIS — Z3682 Encounter for antenatal screening for nuchal translucency: Secondary | ICD-10-CM

## 2016-06-27 DIAGNOSIS — D259 Leiomyoma of uterus, unspecified: Secondary | ICD-10-CM

## 2016-06-27 LAB — POCT URINALYSIS DIPSTICK
Blood, UA: NEGATIVE
Glucose, UA: NEGATIVE
KETONES UA: NEGATIVE
LEUKOCYTES UA: NEGATIVE
Nitrite, UA: NEGATIVE
Protein, UA: NEGATIVE

## 2016-06-27 NOTE — Patient Instructions (Signed)
Nausea & Vomiting  Have saltine crackers or pretzels by your bed and eat a few bites before you raise your head out of bed in the morning  Eat small frequent meals throughout the day instead of large meals  Drink plenty of fluids throughout the day to stay hydrated, just don't drink a lot of fluids with your meals.  This can make your stomach fill up faster making you feel sick  Do not brush your teeth right after you eat  Products with real ginger are good for nausea, like ginger ale and ginger hard candy Make sure it says made with real ginger!  Sucking on sour candy like lemon heads is also good for nausea  If your prenatal vitamins make you nauseated, take them at night so you will sleep through the nausea  Sea Bands  If you feel like you need medicine for the nausea & vomiting please let us know  If you are unable to keep any fluids or food down please let us know     Second Trimester of Pregnancy The second trimester is from week 14 through week 27 (months 4 through 6). The second trimester is often a time when you feel your best. Your body has adjusted to being pregnant, and you begin to feel better physically. Usually, morning sickness has lessened or quit completely, you may have more energy, and you may have an increase in appetite. The second trimester is also a time when the fetus is growing rapidly. At the end of the sixth month, the fetus is about 9 inches long and weighs about 1 pounds. You will likely begin to feel the baby move (quickening) between 16 and 20 weeks of pregnancy. Body changes during your second trimester Your body continues to go through many changes during your second trimester. The changes vary from woman to woman.  Your weight will continue to increase. You will notice your lower abdomen bulging out.  You may begin to get stretch marks on your hips, abdomen, and breasts.  You may develop headaches that can be relieved by medicines. The medicines  should be approved by your health care provider.  You may urinate more often because the fetus is pressing on your bladder.  You may develop or continue to have heartburn as a result of your pregnancy.  You may develop constipation because certain hormones are causing the muscles that push waste through your intestines to slow down.  You may develop hemorrhoids or swollen, bulging veins (varicose veins).  You may have back pain. This is caused by: ? Weight gain. ? Pregnancy hormones that are relaxing the joints in your pelvis. ? A shift in weight and the muscles that support your balance.  Your breasts will continue to grow and they will continue to become tender.  Your gums may bleed and may be sensitive to brushing and flossing.  Dark spots or blotches (chloasma, mask of pregnancy) may develop on your face. This will likely fade after the baby is born.  A dark line from your belly button to the pubic area (linea nigra) may appear. This will likely fade after the baby is born.  You may have changes in your hair. These can include thickening of your hair, rapid growth, and changes in texture. Some women also have hair loss during or after pregnancy, or hair that feels dry or thin. Your hair will most likely return to normal after your baby is born.  What to expect at prenatal visits   During a routine prenatal visit:  You will be weighed to make sure you and the fetus are growing normally.  Your blood pressure will be taken.  Your abdomen will be measured to track your baby's growth.  The fetal heartbeat will be listened to.  Any test results from the previous visit will be discussed.  Your health care provider may ask you:  How you are feeling.  If you are feeling the baby move.  If you have had any abnormal symptoms, such as leaking fluid, bleeding, severe headaches, or abdominal cramping.  If you are using any tobacco products, including cigarettes, chewing tobacco, and  electronic cigarettes.  If you have any questions.  Other tests that may be performed during your second trimester include:  Blood tests that check for: ? Low iron levels (anemia). ? High blood sugar that affects pregnant women (gestational diabetes) between 24 and 28 weeks. ? Rh antibodies. This is to check for a protein on red blood cells (Rh factor).  Urine tests to check for infections, diabetes, or protein in the urine.  An ultrasound to confirm the proper growth and development of the baby.  An amniocentesis to check for possible genetic problems.  Fetal screens for spina bifida and Down syndrome.  HIV (human immunodeficiency virus) testing. Routine prenatal testing includes screening for HIV, unless you choose not to have this test.  Follow these instructions at home: Medicines  Follow your health care provider's instructions regarding medicine use. Specific medicines may be either safe or unsafe to take during pregnancy.  Take a prenatal vitamin that contains at least 600 micrograms (mcg) of folic acid.  If you develop constipation, try taking a stool softener if your health care provider approves. Eating and drinking  Eat a balanced diet that includes fresh fruits and vegetables, whole grains, good sources of protein such as meat, eggs, or tofu, and low-fat dairy. Your health care provider will help you determine the amount of weight gain that is right for you.  Avoid raw meat and uncooked cheese. These carry germs that can cause birth defects in the baby.  If you have low calcium intake from food, talk to your health care provider about whether you should take a daily calcium supplement.  Limit foods that are high in fat and processed sugars, such as fried and sweet foods.  To prevent constipation: ? Drink enough fluid to keep your urine clear or pale yellow. ? Eat foods that are high in fiber, such as fresh fruits and vegetables, whole grains, and  beans. Activity  Exercise only as directed by your health care provider. Most women can continue their usual exercise routine during pregnancy. Try to exercise for 30 minutes at least 5 days a week. Stop exercising if you experience uterine contractions.  Avoid heavy lifting, wear low heel shoes, and practice good posture.  A sexual relationship may be continued unless your health care provider directs you otherwise. Relieving pain and discomfort  Wear a good support bra to prevent discomfort from breast tenderness.  Take warm sitz baths to soothe any pain or discomfort caused by hemorrhoids. Use hemorrhoid cream if your health care provider approves.  Rest with your legs elevated if you have leg cramps or low back pain.  If you develop varicose veins, wear support hose. Elevate your feet for 15 minutes, 3-4 times a day. Limit salt in your diet. Prenatal Care  Write down your questions. Take them to your prenatal visits.  Keep all your   prenatal visits as told by your health care provider. This is important. Safety  Wear your seat belt at all times when driving.  Make a list of emergency phone numbers, including numbers for family, friends, the hospital, and police and fire departments. General instructions  Ask your health care provider for a referral to a local prenatal education class. Begin classes no later than the beginning of month 6 of your pregnancy.  Ask for help if you have counseling or nutritional needs during pregnancy. Your health care provider can offer advice or refer you to specialists for help with various needs.  Do not use hot tubs, steam rooms, or saunas.  Do not douche or use tampons or scented sanitary pads.  Do not cross your legs for long periods of time.  Avoid cat litter boxes and soil used by cats. These carry germs that can cause birth defects in the baby and possibly loss of the fetus by miscarriage or stillbirth.  Avoid all smoking, herbs,  alcohol, and unprescribed drugs. Chemicals in these products can affect the formation and growth of the baby.  Do not use any products that contain nicotine or tobacco, such as cigarettes and e-cigarettes. If you need help quitting, ask your health care provider.  Visit your dentist if you have not gone yet during your pregnancy. Use a soft toothbrush to brush your teeth and be gentle when you floss. Contact a health care provider if:  You have dizziness.  You have mild pelvic cramps, pelvic pressure, or nagging pain in the abdominal area.  You have persistent nausea, vomiting, or diarrhea.  You have a bad smelling vaginal discharge.  You have pain when you urinate. Get help right away if:  You have a fever.  You are leaking fluid from your vagina.  You have spotting or bleeding from your vagina.  You have severe abdominal cramping or pain.  You have rapid weight gain or weight loss.  You have shortness of breath with chest pain.  You notice sudden or extreme swelling of your face, hands, ankles, feet, or legs.  You have not felt your baby move in over an hour.  You have severe headaches that do not go away when you take medicine.  You have vision changes. Summary  The second trimester is from week 14 through week 27 (months 4 through 6). It is also a time when the fetus is growing rapidly.  Your body goes through many changes during pregnancy. The changes vary from woman to woman.  Avoid all smoking, herbs, alcohol, and unprescribed drugs. These chemicals affect the formation and growth your baby.  Do not use any tobacco products, such as cigarettes, chewing tobacco, and e-cigarettes. If you need help quitting, ask your health care provider.  Contact your health care provider if you have any questions. Keep all prenatal visits as told by your health care provider. This is important. This information is not intended to replace advice given to you by your health care  provider. Make sure you discuss any questions you have with your health care provider. Document Released: 03/20/2001 Document Revised: 09/01/2015 Document Reviewed: 05/27/2012 Elsevier Interactive Patient Education  2017 [employer]  

## 2016-06-27 NOTE — Progress Notes (Signed)
Subjective:  Julie Robbins is a 36 y.o. G33P1001 African American female at [redacted]w[redacted]d by 9wk u/s, being seen today for her first obstetrical visit.  Her obstetrical history is significant for prev c/s x 1 for h/o multiple myomectomies.  Multiple current fibroids, last u/s @ 9wks: (#1) post fundal 4.4 x 4.1 x 4.6 cm,(#2) LUS 5.2  X 4.2 x 4.2 cm,(#3) fundal 2.9 x 2.3 x 3 cm. AMA, 36yo. Pregnancy history fully reviewed.  Patient reports mild nausea- declines meds, fatigue. Denies vb, cramping, uti s/s, abnormal/malodorous vag d/c, or vulvovaginal itching/irritation.  BP 100/78   Pulse 82   Wt 162 lb (73.5 kg)   LMP 03/28/2016 (Exact Date)   BMI 27.81 kg/m   HISTORY: OB History  Gravida Para Term Preterm AB Living  2 1 1     1   SAB TAB Ectopic Multiple Live Births        0 1    # Outcome Date GA Lbr Len/2nd Weight Sex Delivery Anes PTL Lv  2 Current           1 Term 02/11/14 [redacted]w[redacted]d  6 lb 7 oz (2.92 kg) F CS-LVertical Spinal N LIV     Past Medical History:  Diagnosis Date  . Medical history non-contributory    Past Surgical History:  Procedure Laterality Date  . CESAREAN SECTION N/A 02/11/2014   Procedure: CESAREAN SECTION;  Surgeon: [name], MD;  Location: [facility];  Service: Obstetrics;  Laterality: N/A;  . MYOMECTOMY  2006,2011,2014   Family History  Problem Relation Age of Onset  . Other Mother     degenerative joint disease  . Hypertension Maternal Grandmother   . Other Maternal Grandmother     degenerative joint disease  . Cancer Paternal Grandfather     multiple myloma    Exam   System:     General: Well developed & nourished, no acute distress   Skin: Warm & dry, normal coloration and turgor, no rashes   Neurologic: Alert & oriented, normal mood   Cardiovascular: Regular rate & rhythm   Respiratory: Effort & rate normal, LCTAB, acyanotic   Abdomen: Soft, non tender   Extremities: normal strength, tone   Pelvic Exam:    Perineum: Normal perineum   Vulva: Normal, no lesions   Vagina:  Normal mucosa, normal discharge   Cervix: Normal, bulbous, appears closed   Uterus: Normal size/shape/contour for GA   Thin prep pap smear obtained w/ high risk HPV cotesting FHR: 158 via doppler   Assessment:   Pregnancy: G2P1001 Patient Active Problem List   Diagnosis Date Noted  . H/O myomectomy 07/15/2013    Priority: High  . Encounter for supervision of other normal pregnancy 06/27/2016  . History of cesarean section 05/15/2016  . Intramural leiomyoma of uterus 07/28/2015  . DEPRESSION, SITUATIONAL 09/22/2008  . MALAISE AND FATIGUE 09/22/2008  . HEADACHE 09/11/2007  . OVERWEIGHT 07/11/2007  . VERTIGO 07/11/2007    [redacted]w[redacted]d G2P1001 New OB visit Prev c/s d/t multiple myomectomies Multiple current uterine fibroids AMA  Plan:  Initial labs obtained Continue prenatal vitamins Problem list reviewed and updated Reviewed n/v relief measures and warning s/s to report Reviewed recommended weight gain based on pre-gravid BMI Encouraged well-balanced diet Genetic Screening discussed Integrated Screen: too late, Requests AFP Cystic fibrosis screening discussed declined Ultrasound discussed; fetal survey: requested Follow up in 4 weeks for visit and AFP CCNC completed, not sent, not applying for [facility] Will need repeat c/s d/t h/o myomectomies  Tawnya Crook CNM, [facility] 06/27/2016 10:09 AM

## 2016-06-29 ENCOUNTER — Telehealth: Payer: Self-pay | Admitting: Women's Health

## 2016-06-29 DIAGNOSIS — Z3482 Encounter for supervision of other normal pregnancy, second trimester: Secondary | ICD-10-CM

## 2016-06-29 DIAGNOSIS — R8271 Bacteriuria: Secondary | ICD-10-CM

## 2016-06-29 LAB — URINALYSIS, ROUTINE W REFLEX MICROSCOPIC
Bilirubin, UA: NEGATIVE
GLUCOSE, UA: NEGATIVE
Ketones, UA: NEGATIVE
LEUKOCYTES UA: NEGATIVE
Nitrite, UA: NEGATIVE
Protein, UA: NEGATIVE
RBC, UA: NEGATIVE
Specific Gravity, UA: 1.012 (ref 1.005–1.030)
UUROB: 0.2 mg/dL (ref 0.2–1.0)
pH, UA: 8 — ABNORMAL HIGH (ref 5.0–7.5)

## 2016-06-29 LAB — CYTOLOGY - PAP
Chlamydia: NEGATIVE
DIAGNOSIS: NEGATIVE
HPV (WINDOPATH): NOT DETECTED
NEISSERIA GONORRHEA: NEGATIVE

## 2016-06-29 LAB — CBC
HEMATOCRIT: 38.2 % (ref 34.0–46.6)
Hemoglobin: 13.3 g/dL (ref 11.1–15.9)
MCH: 30.9 pg (ref 26.6–33.0)
MCHC: 34.8 g/dL (ref 31.5–35.7)
MCV: 89 fL (ref 79–97)
Platelets: 237 10*3/uL (ref 150–379)
RBC: 4.3 x10E6/uL (ref 3.77–5.28)
RDW: 12.6 % (ref 12.3–15.4)
WBC: 5.8 10*3/uL (ref 3.4–10.8)

## 2016-06-29 LAB — RPR: RPR Ser Ql: NONREACTIVE

## 2016-06-29 LAB — PMP SCREEN PROFILE (10S), URINE
Amphetamine Screen, Ur: NEGATIVE ng/mL
BARBITURATE SCRN UR: NEGATIVE ng/mL
BENZODIAZEPINE SCREEN, URINE: NEGATIVE ng/mL
CREATININE(CRT), U: 40.9 mg/dL (ref 20.0–300.0)
Cannabinoids Ur Ql Scn: NEGATIVE ng/mL
Cocaine(Metab.)Screen, Urine: NEGATIVE ng/mL
METHADONE SCREEN, URINE: NEGATIVE ng/mL
Opiate Scrn, Ur: NEGATIVE ng/mL
Oxycodone+Oxymorphone Ur Ql Scn: NEGATIVE ng/mL
PCP Scrn, Ur: NEGATIVE ng/mL
PH UR, DRUG SCRN: 8.5 (ref 4.5–8.9)
Propoxyphene, Screen: NEGATIVE ng/mL

## 2016-06-29 LAB — HIV ANTIBODY (ROUTINE TESTING W REFLEX): HIV Screen 4th Generation wRfx: NONREACTIVE

## 2016-06-29 LAB — ANTIBODY SCREEN: ANTIBODY SCREEN: NEGATIVE

## 2016-06-29 LAB — ABO/RH: Rh Factor: POSITIVE

## 2016-06-29 LAB — URINE CULTURE

## 2016-06-29 LAB — VARICELLA ZOSTER ANTIBODY, IGG: Varicella zoster IgG: 3826 index (ref >165)

## 2016-06-29 LAB — RUBELLA SCREEN: Rubella Antibodies, IGG: 19.2 index (ref >0.99)

## 2016-06-29 LAB — HEPATITIS B SURFACE ANTIGEN: Hepatitis B Surface Ag: NEGATIVE

## 2016-06-29 MED ORDER — AMOXICILLIN 500 MG PO CAPS: 500.0000 mg | ORAL_CAPSULE | Freq: Two times a day (BID) | ORAL | 0 refills | Status: DC

## 2016-06-29 NOTE — Telephone Encounter (Signed)
LM notifying pt of +GBS urine cx, rx amoxicillin, will also be tx in labor.  Roma Schanz, CNM, [facility] 06/29/2016 3:29 PM

## 2016-07-23 ENCOUNTER — Encounter: Payer: Self-pay | Admitting: Obstetrics and Gynecology

## 2016-07-23 ENCOUNTER — Ambulatory Visit (INDEPENDENT_AMBULATORY_CARE_PROVIDER_SITE_OTHER): Payer: BLUE CROSS/BLUE SHIELD | Admitting: Obstetrics and Gynecology

## 2016-07-23 VITALS — BP 110/70 | HR 80 | Wt 167.8 lb

## 2016-07-23 DIAGNOSIS — Z1389 Encounter for screening for other disorder: Secondary | ICD-10-CM

## 2016-07-23 DIAGNOSIS — Z3682 Encounter for antenatal screening for nuchal translucency: Secondary | ICD-10-CM | POA: Diagnosis not present

## 2016-07-23 DIAGNOSIS — Z3482 Encounter for supervision of other normal pregnancy, second trimester: Secondary | ICD-10-CM

## 2016-07-23 DIAGNOSIS — Z331 Pregnant state, incidental: Secondary | ICD-10-CM

## 2016-07-23 LAB — POCT URINALYSIS DIPSTICK
Blood, UA: NEGATIVE
Glucose, UA: NEGATIVE
Ketones, UA: NEGATIVE
Leukocytes, UA: NEGATIVE
Nitrite, UA: NEGATIVE
Protein, UA: NEGATIVE

## 2016-07-23 NOTE — Progress Notes (Signed)
Patient ID: Julie Robbins, female   DOB: 1980/07/08, 36 y.o.   MRN: [id number]  [id number]  Estimated Date of Delivery: 12/27/16 LROB [redacted]w[redacted]d  Chief Complaint  Patient presents with  . Routine Prenatal Visit    AFP   ____  Patient complaints: none.  Patient reports too early for fetal movement. She denies any bleeding, rupture of membranes,or regular contractions.  Blood pressure 110/70, pulse 80, weight 167 lb 12.8 oz (76.1 kg), last menstrual period 03/28/2016.   Urine results:notable for none  refer to the ob flow sheet for FH and FHR,                           Physical Examination: General appearance - alert, well appearing, and in no distress                                      Abdomen - FH 18 cm                                                        -FHR 158 bpm                                                         soft, nontender, nondistended, no masses or organomegaly                                            Questions were answered. Assessment:  1. LROB G2P1001 @ [redacted]w[redacted]d; Estimated Date of Delivery: 12/27/16   2. H/o myomectomies  3. Multiple current fibroids  4. AFP today   Plan:   1. Continued routine obstetrical care 2. Repeat c/s @ 39w  F/u in 4 weeks for routine prenatal care and anatomy scan    By signing my name below, I, Hansel Feinstein, attest that this documentation has been prepared under the direction and in the presence of [name], MD. Electronically Signed: Hansel Feinstein, ED Scribe. 07/23/16. 9:36 AM.  I personally performed the services described in this documentation, which was SCRIBED in my presence. The recorded information has been reviewed and considered accurate. It has been edited as necessary during review. [name], MD

## 2016-07-25 LAB — AFP, QUAD SCREEN
DIA Mom Value: 0.57
DIA VALUE (EIA): 88.79 pg/mL
DSR (By Age)    1 IN: 261
DSR (Second Trimester) 1 IN: 6519
Gestational Age: 17.6 WEEKS
MSAFP Mom: 0.91
MSAFP: 37.6 ng/mL
MSHCG Mom: 1.08
MSHCG: 28662 m[IU]/mL
Maternal Age At EDD: 35.6 yr
Osb Risk: 10000
Test Results:: NEGATIVE
UE3 MOM: 1.18
Weight: 168 [lb_av]
uE3 Value: 1.32 ng/mL

## 2016-07-25 LAB — URINE CULTURE

## 2016-08-17 ENCOUNTER — Other Ambulatory Visit: Payer: Self-pay | Admitting: Obstetrics and Gynecology

## 2016-08-17 DIAGNOSIS — Z363 Encounter for antenatal screening for malformations: Secondary | ICD-10-CM

## 2016-08-20 ENCOUNTER — Other Ambulatory Visit: Payer: BLUE CROSS/BLUE SHIELD

## 2016-08-20 ENCOUNTER — Encounter: Payer: BLUE CROSS/BLUE SHIELD | Admitting: Women's Health

## 2016-08-20 ENCOUNTER — Encounter: Payer: Self-pay | Admitting: Obstetrics & Gynecology

## 2016-08-20 ENCOUNTER — Ambulatory Visit (INDEPENDENT_AMBULATORY_CARE_PROVIDER_SITE_OTHER): Payer: BLUE CROSS/BLUE SHIELD | Admitting: Obstetrics & Gynecology

## 2016-08-20 ENCOUNTER — Ambulatory Visit (INDEPENDENT_AMBULATORY_CARE_PROVIDER_SITE_OTHER): Payer: BLUE CROSS/BLUE SHIELD

## 2016-08-20 VITALS — BP 122/80 | HR 75 | Wt 174.0 lb

## 2016-08-20 DIAGNOSIS — Z363 Encounter for antenatal screening for malformations: Secondary | ICD-10-CM

## 2016-08-20 DIAGNOSIS — Z1389 Encounter for screening for other disorder: Secondary | ICD-10-CM

## 2016-08-20 DIAGNOSIS — Z331 Pregnant state, incidental: Secondary | ICD-10-CM

## 2016-08-20 DIAGNOSIS — Z3482 Encounter for supervision of other normal pregnancy, second trimester: Secondary | ICD-10-CM

## 2016-08-20 LAB — POCT URINALYSIS DIPSTICK
GLUCOSE UA: NEGATIVE
KETONES UA: NEGATIVE
Leukocytes, UA: NEGATIVE
Nitrite, UA: NEGATIVE
Protein, UA: NEGATIVE
RBC UA: NEGATIVE

## 2016-08-20 NOTE — Progress Notes (Signed)
G2P1001 [redacted]w[redacted]d Estimated Date of Delivery: 12/27/16  Blood pressure 122/80, pulse 75, weight 174 lb (78.9 kg), last menstrual period 03/28/2016.   BP weight and urine results all reviewed and noted.  Please refer to the obstetrical flow sheet for the fundal height and fetal heart rate documentation:  Patient reports good fetal movement, denies any bleeding and no rupture of membranes symptoms or regular contractions. Patient is without complaints. All questions were answered.  Orders Placed This Encounter  Procedures  . POCT urinalysis dipstick    Plan:  Continued routine obstetrical care, soogram report is done, LV EICF isolated noted, Normal Quad, no further sonograms required  No Follow-up on file.

## 2016-08-20 NOTE — Progress Notes (Signed)
[place] 38+1 wks,cephalic,cx 5.5 cm,normal right ovary,left ovary not visualized,post pl gr 0,mult fibroid no significant change,svp of fluid 4.3 cm,fhr 166 bpm,LVEICF 1.8 mm,EFW 422 g,anatomy complete

## 2016-09-08 DIAGNOSIS — Z0000 Encounter for general adult medical examination without abnormal findings: Secondary | ICD-10-CM | POA: Diagnosis not present

## 2016-09-08 DIAGNOSIS — Z331 Pregnant state, incidental: Secondary | ICD-10-CM | POA: Diagnosis not present

## 2016-09-19 ENCOUNTER — Encounter: Payer: Self-pay | Admitting: Obstetrics and Gynecology

## 2016-09-19 ENCOUNTER — Ambulatory Visit (INDEPENDENT_AMBULATORY_CARE_PROVIDER_SITE_OTHER): Payer: BLUE CROSS/BLUE SHIELD | Admitting: Obstetrics and Gynecology

## 2016-09-19 VITALS — BP 108/64 | HR 68 | Wt 178.0 lb

## 2016-09-19 DIAGNOSIS — Z3482 Encounter for supervision of other normal pregnancy, second trimester: Secondary | ICD-10-CM

## 2016-09-19 DIAGNOSIS — Z331 Pregnant state, incidental: Secondary | ICD-10-CM

## 2016-09-19 DIAGNOSIS — Z1389 Encounter for screening for other disorder: Secondary | ICD-10-CM

## 2016-09-19 LAB — POCT URINALYSIS DIPSTICK
Blood, UA: NEGATIVE
GLUCOSE UA: NEGATIVE
Ketones, UA: NEGATIVE
Leukocytes, UA: NEGATIVE
NITRITE UA: NEGATIVE
Protein, UA: NEGATIVE

## 2016-09-19 NOTE — Progress Notes (Signed)
Patient ID: Julie Robbins, female   DOB: [birth year], 36 y.o.   MRN: [id number]  [id number]  Estimated Date of Delivery: 12/27/16 [facility] [redacted]w[redacted]d  Chief Complaint  Patient presents with  . Routine Prenatal Visit  ____  Patient complaints: none. She is not sure if she wants tubal ligation at this point.   Patient reports good fetal movement. She denies any bleeding, rupture of membranes,or regular contractions.  Blood pressure 108/64, pulse 68, weight 178 lb (80.7 kg), last menstrual period 03/28/2016.   Urine results:notable for none refer to the ob flow sheet for FH and FHR, ,                          Physical Examination: General appearance - alert, well appearing, and in no distress                                      Abdomen - FH 28 cm                                                        -FHR 156 bpm                                                         soft, nontender, nondistended, no masses or organomegaly                                            Questions were answered. Assessment:  1. LROB G2P1001 @ [redacted]w[redacted]d  2. H/o myomectomies 3. Multiple fibroids   Plan:   1. Continued routine obstetrical care 2. Repeat c/s @ 39w 12/20/16   F/u in 4 weeks for routine prenatal care, in 1-2 weeks for GTT  By signing my name below, I, Hansel Feinstein, attest that this documentation has been prepared under the direction and in the presence of [name], MD. Electronically Signed: Hansel Feinstein, ED Scribe. 09/19/16. 9:08 AM.  I personally performed the services described in this documentation, which was SCRIBED in my presence. The recorded information has been reviewed and considered accurate. It has been edited as necessary during review. [name], MD

## 2016-09-25 ENCOUNTER — Telehealth: Payer: Self-pay | Admitting: Advanced Practice Midwife

## 2016-09-25 NOTE — Telephone Encounter (Signed)
Patient called stating she was having mild cramping this am but has now resolved. She is not bleeding or leaking fluid but thinks she may have a yeast infection. Advised patient to push fluids and try Monistat 7 OTC and if it did not help, to let us know so we could try to see her sooner. Verbalized understanding.

## 2016-10-01 DIAGNOSIS — Z029 Encounter for administrative examinations, unspecified: Secondary | ICD-10-CM

## 2016-10-03 ENCOUNTER — Other Ambulatory Visit: Payer: BLUE CROSS/BLUE SHIELD

## 2016-10-05 ENCOUNTER — Other Ambulatory Visit: Payer: BLUE CROSS/BLUE SHIELD

## 2016-10-11 ENCOUNTER — Other Ambulatory Visit: Payer: BLUE CROSS/BLUE SHIELD

## 2016-10-11 ENCOUNTER — Ambulatory Visit (INDEPENDENT_AMBULATORY_CARE_PROVIDER_SITE_OTHER): Payer: BLUE CROSS/BLUE SHIELD | Admitting: Women's Health

## 2016-10-11 ENCOUNTER — Encounter: Payer: Self-pay | Admitting: Women's Health

## 2016-10-11 VITALS — BP 100/60 | HR 76 | Wt 180.3 lb

## 2016-10-11 DIAGNOSIS — Z3482 Encounter for supervision of other normal pregnancy, second trimester: Secondary | ICD-10-CM

## 2016-10-11 DIAGNOSIS — O09893 Supervision of other high risk pregnancies, third trimester: Secondary | ICD-10-CM

## 2016-10-11 DIAGNOSIS — Z3483 Encounter for supervision of other normal pregnancy, third trimester: Secondary | ICD-10-CM

## 2016-10-11 DIAGNOSIS — Z3A29 29 weeks gestation of pregnancy: Secondary | ICD-10-CM

## 2016-10-11 DIAGNOSIS — Z331 Pregnant state, incidental: Secondary | ICD-10-CM

## 2016-10-11 DIAGNOSIS — O34211 Maternal care for low transverse scar from previous cesarean delivery: Secondary | ICD-10-CM

## 2016-10-11 DIAGNOSIS — Z98891 History of uterine scar from previous surgery: Secondary | ICD-10-CM

## 2016-10-11 DIAGNOSIS — Z131 Encounter for screening for diabetes mellitus: Secondary | ICD-10-CM | POA: Diagnosis not present

## 2016-10-11 DIAGNOSIS — Z1389 Encounter for screening for other disorder: Secondary | ICD-10-CM

## 2016-10-11 LAB — POCT URINALYSIS DIPSTICK
Blood, UA: NEGATIVE
GLUCOSE UA: NEGATIVE
KETONES UA: NEGATIVE
LEUKOCYTES UA: NEGATIVE
Nitrite, UA: NEGATIVE
Protein, UA: NEGATIVE

## 2016-10-11 NOTE — Patient Instructions (Signed)

## 2016-10-11 NOTE — Progress Notes (Signed)
Low-risk OB appointment [id number] [redacted]w[redacted]d Estimated Date of Delivery: 12/27/16 BP 100/60   Pulse 76   Wt 180 lb 4.8 oz (81.8 kg)   LMP 03/28/2016 (Exact Date)   BMI 30.95 kg/m   BP, weight, and urine reviewed.  Refer to obstetrical flow sheet for FH & FHR.  Reports good fm.  Denies regular uc's, lof, vb, or uti s/s. No complaints. Thinks she had yeast infection few weeks ago, took monistat and it went away Reviewed ptl s/s, fkc. Recommended Tdap at HD/PCP per CDC guidelines.  Plan:  Continue routine obstetrical care  F/U in 3wks for OB appointment  PN2 today

## 2016-10-12 LAB — RPR: RPR Ser Ql: NONREACTIVE

## 2016-10-12 LAB — GLUCOSE TOLERANCE, 2 HOURS W/ 1HR
GLUCOSE, 2 HOUR: 108 mg/dL (ref 65–152)
GLUCOSE, FASTING: 71 mg/dL (ref 65–91)
Glucose, 1 hour: 131 mg/dL (ref 65–179)

## 2016-10-12 LAB — CBC
Hematocrit: 37.4 % (ref 34.0–46.6)
Hemoglobin: 12.8 g/dL (ref 11.1–15.9)
MCH: 31.4 pg (ref 26.6–33.0)
MCHC: 34.2 g/dL (ref 31.5–35.7)
MCV: 92 fL (ref 79–97)
PLATELETS: 188 10*3/uL (ref 150–379)
RBC: 4.07 x10E6/uL (ref 3.77–5.28)
RDW: 13.7 % (ref 12.3–15.4)
WBC: 6.1 10*3/uL (ref 3.4–10.8)

## 2016-10-12 LAB — ANTIBODY SCREEN: ANTIBODY SCREEN: NEGATIVE

## 2016-10-12 LAB — HIV ANTIBODY (ROUTINE TESTING W REFLEX): HIV Screen 4th Generation wRfx: NONREACTIVE

## 2016-11-02 ENCOUNTER — Ambulatory Visit (INDEPENDENT_AMBULATORY_CARE_PROVIDER_SITE_OTHER): Payer: BLUE CROSS/BLUE SHIELD | Admitting: Obstetrics and Gynecology

## 2016-11-02 ENCOUNTER — Encounter: Payer: Self-pay | Admitting: Obstetrics and Gynecology

## 2016-11-02 VITALS — BP 112/64 | HR 88 | Wt 184.0 lb

## 2016-11-02 DIAGNOSIS — Z3A32 32 weeks gestation of pregnancy: Secondary | ICD-10-CM

## 2016-11-02 DIAGNOSIS — Z331 Pregnant state, incidental: Secondary | ICD-10-CM

## 2016-11-02 DIAGNOSIS — Z1389 Encounter for screening for other disorder: Secondary | ICD-10-CM

## 2016-11-02 DIAGNOSIS — Z3483 Encounter for supervision of other normal pregnancy, third trimester: Secondary | ICD-10-CM

## 2016-11-02 LAB — POCT URINALYSIS DIPSTICK
Glucose, UA: NEGATIVE
KETONES UA: NEGATIVE
Leukocytes, UA: NEGATIVE
NITRITE UA: NEGATIVE
Protein, UA: NEGATIVE
RBC UA: NEGATIVE

## 2016-11-02 NOTE — Progress Notes (Signed)
[id number]  Estimated Date of Delivery: 12/27/16 LROB 103w1d  Chief Complaint  Patient presents with  . Routine Prenatal Visit  ____  Patient complaints:Feels like she is negative and she should be. Patient reminded she does have fibroids and the overall size seems appropriate. Patient reports   good fetal movement,                           denies any bleeding , rupture of membranes,or regular contractions. Discuss tubal ligation. Patient is not ready for tubal ligation at this time Blood pressure 112/64, pulse 88, weight 184 lb (83.5 kg), last menstrual period 03/28/2016.   Urine results:notable for negative protein glucose refer to the ob flow sheet for FH and FHR, ,                          Physical Examination: General appearance - alert, well appearing, and in no distress and oriented to person, place, and time                                      Abdomen - FH 34.5  ,                                                         -FHR 132                                                         soft, nontender, nondistended, no masses or organomegaly scars from previous incisions midline lower abdominal incision without hernia                                      Pelvic - not done                                            Questions were answered. Assessment: LROB G2P1001 @ [redacted]w[redacted]d history of multiple uterine fibroids with myomectomy multiple  Plan:  Continued routine obstetrical care, follow-up 2 weeks, repeat C-section at 39 weeks scheduled for September 13 already  F/u in 2  weeks for LR OB

## 2016-11-19 ENCOUNTER — Encounter: Payer: Self-pay | Admitting: Obstetrics and Gynecology

## 2016-11-19 ENCOUNTER — Ambulatory Visit (INDEPENDENT_AMBULATORY_CARE_PROVIDER_SITE_OTHER): Payer: BLUE CROSS/BLUE SHIELD | Admitting: Obstetrics and Gynecology

## 2016-11-19 VITALS — BP 90/60 | HR 78 | Wt 187.8 lb

## 2016-11-19 DIAGNOSIS — Z3A34 34 weeks gestation of pregnancy: Secondary | ICD-10-CM

## 2016-11-19 DIAGNOSIS — O3413 Maternal care for benign tumor of corpus uteri, third trimester: Secondary | ICD-10-CM

## 2016-11-19 DIAGNOSIS — D259 Leiomyoma of uterus, unspecified: Secondary | ICD-10-CM

## 2016-11-19 DIAGNOSIS — O09893 Supervision of other high risk pregnancies, third trimester: Secondary | ICD-10-CM

## 2016-11-19 DIAGNOSIS — Z3482 Encounter for supervision of other normal pregnancy, second trimester: Secondary | ICD-10-CM

## 2016-11-19 DIAGNOSIS — Z1389 Encounter for screening for other disorder: Secondary | ICD-10-CM

## 2016-11-19 DIAGNOSIS — Z331 Pregnant state, incidental: Secondary | ICD-10-CM

## 2016-11-19 LAB — POCT URINALYSIS DIPSTICK
Blood, UA: NEGATIVE
Glucose, UA: NEGATIVE
KETONES UA: NEGATIVE
LEUKOCYTES UA: NEGATIVE
Nitrite, UA: NEGATIVE

## 2016-11-19 NOTE — Progress Notes (Signed)
[id number]  Estimated Date of Delivery: 12/27/16 LROB [redacted]w[redacted]d  Chief Complaint  Patient presents with  . Routine Prenatal Visit  ____  Patient complaints:none. NOT READY TO DECIDE ON BTL Patient reports   good fetal movement,                           denies any bleeding , rupture of membranes,or regular contractions.  Blood pressure 90/60, pulse 78, weight 187 lb 12.8 oz (85.2 kg), last menstrual period 03/28/2016.   Urine results:notable for neg protein , gluc refer to the ob flow sheet for FH and FHR, ,                          Physical Examination: General appearance - alert, well appearing, and in no distress                                      Abdomen - FH 336 ,                                                         -FHR 135                                                         soft, nontender, nondistended, no masses or organomegaly Gravid uterus, midline scar                                      Pelvic - n/a                                             Questions were answered. Assessment: LROB G2P1001 @ 110w4d ut fibroids, prior c/s for repeat  Plan:  Continued routine obstetrical care, u/s growth 2 wk  F/u in 2 weeks for u/s lrob

## 2016-12-03 ENCOUNTER — Ambulatory Visit (INDEPENDENT_AMBULATORY_CARE_PROVIDER_SITE_OTHER): Payer: BLUE CROSS/BLUE SHIELD | Admitting: Obstetrics & Gynecology

## 2016-12-03 ENCOUNTER — Encounter: Payer: Self-pay | Admitting: Obstetrics & Gynecology

## 2016-12-03 ENCOUNTER — Ambulatory Visit (INDEPENDENT_AMBULATORY_CARE_PROVIDER_SITE_OTHER): Payer: BLUE CROSS/BLUE SHIELD

## 2016-12-03 VITALS — BP 112/62 | HR 81 | Wt 190.0 lb

## 2016-12-03 DIAGNOSIS — Z1389 Encounter for screening for other disorder: Secondary | ICD-10-CM

## 2016-12-03 DIAGNOSIS — Z3482 Encounter for supervision of other normal pregnancy, second trimester: Secondary | ICD-10-CM

## 2016-12-03 DIAGNOSIS — O3413 Maternal care for benign tumor of corpus uteri, third trimester: Secondary | ICD-10-CM | POA: Diagnosis not present

## 2016-12-03 DIAGNOSIS — Z331 Pregnant state, incidental: Secondary | ICD-10-CM

## 2016-12-03 DIAGNOSIS — Z3483 Encounter for supervision of other normal pregnancy, third trimester: Secondary | ICD-10-CM

## 2016-12-03 DIAGNOSIS — Z3A36 36 weeks gestation of pregnancy: Secondary | ICD-10-CM | POA: Diagnosis not present

## 2016-12-03 DIAGNOSIS — D259 Leiomyoma of uterus, unspecified: Secondary | ICD-10-CM | POA: Diagnosis not present

## 2016-12-03 LAB — POCT URINALYSIS DIPSTICK
Glucose, UA: NEGATIVE
KETONES UA: NEGATIVE
LEUKOCYTES UA: NEGATIVE
NITRITE UA: NEGATIVE
PROTEIN UA: NEGATIVE

## 2016-12-03 NOTE — Progress Notes (Signed)
[place] 52+4 wks,cephalic,post pl gr 2,bilat adnexa's wnl,mult fibroids (#1) ant fundal 5.6 x 2.9 x 4.1 cm,(#2) lus 6 x 3.4 x 5.5 cm,afi 13.8 cm,fhr 144 bpm,EFW 3134 g 63%

## 2016-12-03 NOTE — Progress Notes (Signed)
G2P1001 [redacted]w[redacted]d Estimated Date of Delivery: 12/27/16  Blood pressure 112/62, pulse 81, weight 190 lb (86.2 kg), last menstrual period 03/28/2016.   BP weight and urine results all reviewed and noted.  Please refer to the obstetrical flow sheet for the fundal height and fetal heart rate documentation:  Patient reports good fetal movement, denies any bleeding and no rupture of membranes symptoms or regular contractions. Patient is without complaints. All questions were answered.  Orders Placed This Encounter  Procedures  . GC/Chlamydia Probe Amp  . POCT Urinalysis Dipstick    Plan:  Continued routine obstetrical care,   Return in about 1 week (around 12/10/2016) for LROB.

## 2016-12-05 ENCOUNTER — Telehealth (HOSPITAL_COMMUNITY): Payer: Self-pay | Admitting: *Deleted

## 2016-12-05 LAB — GC/CHLAMYDIA PROBE AMP
CHLAMYDIA, DNA PROBE: NEGATIVE
NEISSERIA GONORRHOEAE BY PCR: NEGATIVE

## 2016-12-05 NOTE — Telephone Encounter (Signed)
Preadmission screen  

## 2016-12-06 ENCOUNTER — Encounter (HOSPITAL_COMMUNITY): Payer: Self-pay

## 2016-12-12 ENCOUNTER — Ambulatory Visit (INDEPENDENT_AMBULATORY_CARE_PROVIDER_SITE_OTHER): Payer: BLUE CROSS/BLUE SHIELD | Admitting: Advanced Practice Midwife

## 2016-12-12 ENCOUNTER — Encounter: Payer: Self-pay | Admitting: Advanced Practice Midwife

## 2016-12-12 VITALS — BP 112/68 | HR 84 | Wt 192.0 lb

## 2016-12-12 DIAGNOSIS — Z331 Pregnant state, incidental: Secondary | ICD-10-CM

## 2016-12-12 DIAGNOSIS — Z3A37 37 weeks gestation of pregnancy: Secondary | ICD-10-CM

## 2016-12-12 DIAGNOSIS — Z3483 Encounter for supervision of other normal pregnancy, third trimester: Secondary | ICD-10-CM

## 2016-12-12 DIAGNOSIS — Z1389 Encounter for screening for other disorder: Secondary | ICD-10-CM

## 2016-12-12 LAB — POCT URINALYSIS DIPSTICK
Blood, UA: NEGATIVE
GLUCOSE UA: NEGATIVE
KETONES UA: NEGATIVE
LEUKOCYTES UA: NEGATIVE
Nitrite, UA: NEGATIVE
Protein, UA: NEGATIVE

## 2016-12-12 NOTE — Progress Notes (Signed)
G2P1001 [redacted]w[redacted]d Estimated Date of Delivery: 12/27/16  Blood pressure 112/68, pulse 84, weight 192 lb (87.1 kg), last menstrual period 03/28/2016.   BP weight and urine results all reviewed and noted.  Please refer to the obstetrical flow sheet for the fundal height and fetal heart rate documentation:  Patient reports good fetal movement, denies any bleeding and no rupture of membranes symptoms or regular contractions. Patient is without complaints. All questions were answered.  Orders Placed This Encounter  Procedures  . POCT urinalysis dipstick    Plan:  Continued routine obstetrical care, CS scheduled for next Thursday. 39  Return in about 1 week (around 12/19/2016) for [place].

## 2016-12-17 ENCOUNTER — Encounter: Payer: Self-pay | Admitting: Obstetrics and Gynecology

## 2016-12-17 ENCOUNTER — Ambulatory Visit (INDEPENDENT_AMBULATORY_CARE_PROVIDER_SITE_OTHER): Payer: BLUE CROSS/BLUE SHIELD | Admitting: Obstetrics and Gynecology

## 2016-12-17 VITALS — BP 90/60 | HR 84 | Wt 194.0 lb

## 2016-12-17 DIAGNOSIS — Z3482 Encounter for supervision of other normal pregnancy, second trimester: Secondary | ICD-10-CM

## 2016-12-17 DIAGNOSIS — Z331 Pregnant state, incidental: Secondary | ICD-10-CM

## 2016-12-17 DIAGNOSIS — Z1389 Encounter for screening for other disorder: Secondary | ICD-10-CM

## 2016-12-17 DIAGNOSIS — Z3A38 38 weeks gestation of pregnancy: Secondary | ICD-10-CM

## 2016-12-17 LAB — POCT URINALYSIS DIPSTICK
Blood, UA: NEGATIVE
Glucose, UA: NEGATIVE
LEUKOCYTES UA: NEGATIVE
Nitrite, UA: NEGATIVE
PROTEIN UA: NEGATIVE

## 2016-12-17 NOTE — Progress Notes (Signed)
Patient ID: Julie Robbins, female   DOB: [birth year], 36 y.o.   MRN: [id number]  Estimated Date of Delivery: 12/27/16 LROB [redacted]w[redacted]d  Chief Complaint  Patient presents with  . Routine Prenatal Visit  ____  Patient complaints: No complaints. Patient reports good fetal movement, denies any bleeding, rupture of membranes,or regular contractions.  Last menstrual period 03/28/2016.   Urine results:notable for small ketones otherwise negative refer to the ob flow sheet for FH and FHR, ,                          Physical Examination: General appearance - alert, well appearing, and in no distress and oriented to person, place, and time                                      Abdomen - FH 39 cm                                                         -FHR 130  Questions were answered. Assessment: LROB G2P1001 @ [redacted]w[redacted]d   Plan:  Continued routine obstetrical care, pt is scheduled for a C-section on 12/20/16.  F/u in 10 days for incision check  By signing my name below, I, Margit Banda, attest that this documentation has been prepared under the direction and in the presence of [name], MD. Electronically Signed: Margit Banda, Medical Scribe. 12/17/16. 9:14 AM.  I personally performed the services described in this documentation, which was SCRIBED in my presence. The recorded information has been reviewed and considered accurate. It has been edited as necessary during review. [name], MD

## 2016-12-18 ENCOUNTER — Other Ambulatory Visit: Payer: Self-pay | Admitting: Obstetrics and Gynecology

## 2016-12-18 NOTE — H&P (Signed)
Julie Robbins is a 36 y.o. female presenting for scheduled repeat cesarean section 12/20/2016 at 25 AM. She is a G2P1001 who will be 39 weeks that date after pregnancy followed at [facility] and notable for prior multiple myomectomy 2, by Dr. Glo Robbins, and by Dr. Kerin Robbins . At the time of the previous cesarean she was found to have excellent healing with no significant adhesions. She has several small fibroids now that are clinically asymptomatic. Pregnancy course has been uneventful with appropriate fetal growth by ultrasound and steady fundal height change.. She does not know what her permanent contraception plans will be and no decision is being made at this time . OB History    Gravida Para Term Preterm AB Living   2 1 1     1    SAB TAB Ectopic Multiple Live Births         0 1     Past Medical History:  Diagnosis Date  . Medical history non-contributory    Past Surgical History:  Procedure Laterality Date  . CESAREAN SECTION N/A 02/11/2014   Procedure: CESAREAN SECTION;  Surgeon: Julie Kind, MD;  Location: [facility];  Service: Obstetrics;  Laterality: N/A;  . MYOMECTOMY  2006,2011,2014   Family History: family history includes Cancer in her paternal grandfather; Hypertension in her maternal grandmother; Other in her maternal grandmother and mother. Social History:  reports that she has never smoked. She has never used smokeless tobacco. She reports that she does not drink alcohol or use drugs.     Maternal Diabetes: No Genetic Screening: Normal Maternal Ultrasounds/Referrals: Normal 63rd percentile at 37 weeks growth ultrasound Fetal Ultrasounds or other Referrals:  None Maternal Substance Abuse:  No Significant Maternal Medications:  None Significant Maternal Lab Results:  None history of group B strep positive with prior pregnancy. I do not see any positive results this pregnancy and I am not sure she was tested at 37 weeks Other Comments:   None  ROS History   Last menstrual period 03/28/2016. Exam Physical Exam  Prenatal labs: ABO, Rh: O/Positive/-- (03/21 1042) Antibody: Negative (07/05 0839) Rubella: 19.20 (03/21 1042) RPR: Non Reactive (07/05 0839)  HBsAg: Negative (03/21 1042)  HIV:    GBS:   not done  Assessment/Plan: Pregnancy [redacted] weeks gestation Previous cesarean section 1 not for trial of labor History of myomectomies 2 previous surgeries recommended for cesarean deliveries vertical midline abdominal incision will be repeated  Procedure discussed with patient at her last office visit 12/17/2009 Julie Robbins V 12/18/2016, 11:59 AM

## 2016-12-19 ENCOUNTER — Encounter (HOSPITAL_COMMUNITY)
Admission: RE | Admit: 2016-12-19 | Discharge: 2016-12-19 | Disposition: A | Discharge status: Home / Self Care | Payer: BLUE CROSS/BLUE SHIELD | Source: Ambulatory Visit | Attending: Obstetrics and Gynecology | Admitting: Obstetrics and Gynecology

## 2016-12-19 DIAGNOSIS — O34211 Maternal care for low transverse scar from previous cesarean delivery: Secondary | ICD-10-CM | POA: Diagnosis not present

## 2016-12-19 DIAGNOSIS — E669 Obesity, unspecified: Secondary | ICD-10-CM | POA: Diagnosis not present

## 2016-12-19 DIAGNOSIS — O99824 Streptococcus B carrier state complicating childbirth: Secondary | ICD-10-CM | POA: Diagnosis not present

## 2016-12-19 DIAGNOSIS — O99214 Obesity complicating childbirth: Secondary | ICD-10-CM | POA: Diagnosis not present

## 2016-12-19 DIAGNOSIS — Z3A39 39 weeks gestation of pregnancy: Secondary | ICD-10-CM | POA: Diagnosis not present

## 2016-12-19 DIAGNOSIS — D259 Leiomyoma of uterus, unspecified: Secondary | ICD-10-CM | POA: Diagnosis not present

## 2016-12-19 DIAGNOSIS — Z6833 Body mass index (BMI) 33.0-33.9, adult: Secondary | ICD-10-CM | POA: Diagnosis not present

## 2016-12-19 DIAGNOSIS — Z23 Encounter for immunization: Secondary | ICD-10-CM | POA: Diagnosis not present

## 2016-12-19 DIAGNOSIS — O34219 Maternal care for unspecified type scar from previous cesarean delivery: Secondary | ICD-10-CM | POA: Diagnosis not present

## 2016-12-19 DIAGNOSIS — O3413 Maternal care for benign tumor of corpus uteri, third trimester: Secondary | ICD-10-CM | POA: Diagnosis not present

## 2016-12-19 LAB — TYPE AND SCREEN
ABO/RH(D): O POS
Antibody Screen: NEGATIVE

## 2016-12-19 LAB — URINALYSIS, ROUTINE W REFLEX MICROSCOPIC
Bilirubin Urine: NEGATIVE
Glucose, UA: NEGATIVE mg/dL
HGB URINE DIPSTICK: NEGATIVE
Ketones, ur: NEGATIVE mg/dL
Leukocytes, UA: NEGATIVE
NITRITE: NEGATIVE
PROTEIN: NEGATIVE mg/dL
SPECIFIC GRAVITY, URINE: 1.02 (ref 1.005–1.030)
pH: 7 (ref 5.0–8.0)

## 2016-12-19 LAB — CBC
HCT: 34.6 % — ABNORMAL LOW (ref 36.0–46.0)
HEMOGLOBIN: 12.1 g/dL (ref 12.0–15.0)
MCH: 31.5 pg (ref 26.0–34.0)
MCHC: 35 g/dL (ref 30.0–36.0)
MCV: 90.1 fL (ref 78.0–100.0)
PLATELETS: 156 10*3/uL (ref 150–400)
RBC: 3.84 MIL/uL — AB (ref 3.87–5.11)
RDW: 13.4 % (ref 11.5–15.5)
WBC: 5.2 10*3/uL (ref 4.0–10.5)

## 2016-12-19 NOTE — Patient Instructions (Signed)
20 Julie Robbins  12/19/2016   Your procedure is scheduled on:  12/20/2016  Enter through the Main Entrance of [facility] at [place] up the phone at the desk and dial [phone].   Call this number if you have problems the morning of surgery: [phone]   Remember:   Do not eat food:After Midnight.  Do not drink clear liquids: After Midnight.  Take these medicines the morning of surgery with A SIP OF WATER: none   Do not wear jewelry, make-up or nail polish.  Do not wear lotions, powders, or perfumes. Do not wear deodorant.  Do not shave 48 hours prior to surgery.  Do not bring valuables to the hospital.  [facility] is not   responsible for any belongings or valuables brought to the hospital.  Contacts, dentures or bridgework may not be worn into surgery.  Leave suitcase in the car. After surgery it may be brought to your room.  For patients admitted to the hospital, checkout time is 11:00 AM the day of              discharge.   Patients discharged the day of surgery will not be allowed to drive             home.  Name and phone number of your driver: na  Special Instructions:   N/A   Please read over the following fact sheets that you were given:   Surgical Site Infection Prevention

## 2016-12-20 ENCOUNTER — Encounter (HOSPITAL_COMMUNITY): Payer: Self-pay | Admitting: *Deleted

## 2016-12-20 ENCOUNTER — Inpatient Hospital Stay (HOSPITAL_COMMUNITY): Payer: BLUE CROSS/BLUE SHIELD | Admitting: Certified Registered Nurse Anesthetist

## 2016-12-20 ENCOUNTER — Inpatient Hospital Stay (HOSPITAL_COMMUNITY)
Admission: AD | Admit: 2016-12-20 | Discharge: 2016-12-22 | DRG: 766 | Disposition: A | Discharge status: Home / Self Care | Payer: BLUE CROSS/BLUE SHIELD | Source: Ambulatory Visit | Attending: Obstetrics and Gynecology | Admitting: Obstetrics and Gynecology

## 2016-12-20 ENCOUNTER — Encounter (HOSPITAL_COMMUNITY)
Admission: AD | Disposition: A | Discharge status: Home / Self Care | Payer: Self-pay | Source: Ambulatory Visit | Attending: Obstetrics and Gynecology

## 2016-12-20 DIAGNOSIS — D259 Leiomyoma of uterus, unspecified: Secondary | ICD-10-CM | POA: Diagnosis present

## 2016-12-20 DIAGNOSIS — R42 Dizziness and giddiness: Secondary | ICD-10-CM

## 2016-12-20 DIAGNOSIS — O34211 Maternal care for low transverse scar from previous cesarean delivery: Principal | ICD-10-CM | POA: Diagnosis present

## 2016-12-20 DIAGNOSIS — Z3482 Encounter for supervision of other normal pregnancy, second trimester: Secondary | ICD-10-CM

## 2016-12-20 DIAGNOSIS — O3413 Maternal care for benign tumor of corpus uteri, third trimester: Secondary | ICD-10-CM | POA: Diagnosis present

## 2016-12-20 DIAGNOSIS — Z9889 Other specified postprocedural states: Secondary | ICD-10-CM

## 2016-12-20 DIAGNOSIS — F4321 Adjustment disorder with depressed mood: Secondary | ICD-10-CM

## 2016-12-20 DIAGNOSIS — O34219 Maternal care for unspecified type scar from previous cesarean delivery: Secondary | ICD-10-CM | POA: Diagnosis not present

## 2016-12-20 DIAGNOSIS — E669 Obesity, unspecified: Secondary | ICD-10-CM | POA: Diagnosis present

## 2016-12-20 DIAGNOSIS — O99824 Streptococcus B carrier state complicating childbirth: Secondary | ICD-10-CM | POA: Diagnosis present

## 2016-12-20 DIAGNOSIS — R8271 Bacteriuria: Secondary | ICD-10-CM

## 2016-12-20 DIAGNOSIS — O99214 Obesity complicating childbirth: Secondary | ICD-10-CM | POA: Diagnosis present

## 2016-12-20 DIAGNOSIS — Z3A39 39 weeks gestation of pregnancy: Secondary | ICD-10-CM

## 2016-12-20 DIAGNOSIS — Z98891 History of uterine scar from previous surgery: Secondary | ICD-10-CM

## 2016-12-20 DIAGNOSIS — Z6833 Body mass index (BMI) 33.0-33.9, adult: Secondary | ICD-10-CM | POA: Diagnosis not present

## 2016-12-20 LAB — RPR: RPR: NONREACTIVE

## 2016-12-20 SURGERY — Surgical Case
Anesthesia: Spinal

## 2016-12-20 MED ORDER — MORPHINE SULFATE (PF) 0.5 MG/ML IJ SOLN
INTRAMUSCULAR | Status: DC | PRN
  Administered 2016-12-20: .2 mg via INTRATHECAL

## 2016-12-20 MED ORDER — PHENYLEPHRINE 8 MG IN D5W 100 ML (0.08MG/ML) PREMIX OPTIME
INJECTION | INTRAVENOUS | Status: DC | PRN
  Administered 2016-12-20: 60 ug/min via INTRAVENOUS

## 2016-12-20 MED ORDER — BUPIVACAINE IN DEXTROSE 0.75-8.25 % IT SOLN
INTRATHECAL | Status: DC | PRN
  Administered 2016-12-20: 10 mg via INTRATHECAL

## 2016-12-20 MED ORDER — LACTATED RINGERS IV SOLN
INTRAVENOUS | Status: DC | PRN
  Administered 2016-12-20: 40 [IU] via INTRAVENOUS

## 2016-12-20 MED ORDER — ONDANSETRON HCL 4 MG/2ML IJ SOLN
INTRAMUSCULAR | Status: AC
  Filled 2016-12-20: qty 2

## 2016-12-20 MED ORDER — SODIUM CHLORIDE 0.9 % IR SOLN
Status: DC | PRN
  Administered 2016-12-20: 1000 mL

## 2016-12-20 MED ORDER — DIPHENHYDRAMINE HCL 50 MG/ML IJ SOLN: 12.5000 mg | INTRAMUSCULAR | Status: DC | PRN

## 2016-12-20 MED ORDER — LACTATED RINGERS IV SOLN
INTRAVENOUS | Status: DC
  Administered 2016-12-20: 11:00:00 via INTRAVENOUS
  Administered 2016-12-20: 125 mL via INTRAVENOUS
  Administered 2016-12-20: 12:00:00 via INTRAVENOUS

## 2016-12-20 MED ORDER — FENTANYL CITRATE (PF) 100 MCG/2ML IJ SOLN
INTRAMUSCULAR | Status: AC
  Filled 2016-12-20: qty 2

## 2016-12-20 MED ORDER — KETOROLAC TROMETHAMINE 30 MG/ML IJ SOLN
INTRAMUSCULAR | Status: AC
Start: 2016-12-20 — End: 2016-12-20
  Administered 2016-12-20: 30 mg
  Filled 2016-12-20: qty 1

## 2016-12-20 MED ORDER — ZOLPIDEM TARTRATE 5 MG PO TABS: 5.0000 mg | ORAL_TABLET | Freq: Every evening | ORAL | Status: DC | PRN

## 2016-12-20 MED ORDER — DIPHENHYDRAMINE HCL 25 MG PO CAPS: 25.0000 mg | ORAL_CAPSULE | ORAL | Status: DC | PRN

## 2016-12-20 MED ORDER — NALBUPHINE HCL 10 MG/ML IJ SOLN: 5.0000 mg | Freq: Once | INTRAMUSCULAR | Status: DC | PRN

## 2016-12-20 MED ORDER — SOD CITRATE-CITRIC ACID 500-334 MG/5ML PO SOLN
30.0000 mL | Freq: Once | ORAL | Status: AC
  Administered 2016-12-20: 30 mL via ORAL
  Filled 2016-12-20: qty 15

## 2016-12-20 MED ORDER — LACTATED RINGERS IV SOLN
INTRAVENOUS | Status: DC
  Administered 2016-12-20: 21:00:00 via INTRAVENOUS

## 2016-12-20 MED ORDER — ONDANSETRON HCL 4 MG/2ML IJ SOLN: 4.0000 mg | Freq: Three times a day (TID) | INTRAMUSCULAR | Status: DC | PRN

## 2016-12-20 MED ORDER — SIMETHICONE 80 MG PO CHEW: 80.0000 mg | CHEWABLE_TABLET | ORAL | Status: DC | PRN

## 2016-12-20 MED ORDER — CEFAZOLIN SODIUM-DEXTROSE 2-4 GM/100ML-% IV SOLN
2.0000 g | INTRAVENOUS | Status: AC
  Administered 2016-12-20: 2 g via INTRAVENOUS
  Filled 2016-12-20: qty 100

## 2016-12-20 MED ORDER — NALBUPHINE HCL 10 MG/ML IJ SOLN: 5.0000 mg | INTRAMUSCULAR | Status: DC | PRN

## 2016-12-20 MED ORDER — PHENYLEPHRINE 8 MG IN D5W 100 ML (0.08MG/ML) PREMIX OPTIME
INJECTION | INTRAVENOUS | Status: AC
  Filled 2016-12-20: qty 100

## 2016-12-20 MED ORDER — SENNOSIDES-DOCUSATE SODIUM 8.6-50 MG PO TABS
2.0000 | ORAL_TABLET | ORAL | Status: DC
  Administered 2016-12-21 – 2016-12-22 (×2): 2 via ORAL
  Filled 2016-12-20 (×2): qty 2

## 2016-12-20 MED ORDER — BUPIVACAINE IN DEXTROSE 0.75-8.25 % IT SOLN
INTRATHECAL | Status: AC
  Filled 2016-12-20: qty 2

## 2016-12-20 MED ORDER — COCONUT OIL OIL
1.0000 "application " | TOPICAL_OIL | Status: DC | PRN
  Administered 2016-12-20: 1 via TOPICAL
  Filled 2016-12-20: qty 120

## 2016-12-20 MED ORDER — IBUPROFEN 600 MG PO TABS
600.0000 mg | ORAL_TABLET | Freq: Four times a day (QID) | ORAL | Status: DC
  Administered 2016-12-20 – 2016-12-22 (×8): 600 mg via ORAL
  Filled 2016-12-20 (×8): qty 1

## 2016-12-20 MED ORDER — SCOPOLAMINE 1 MG/3DAYS TD PT72
1.0000 | MEDICATED_PATCH | Freq: Once | TRANSDERMAL | Status: DC
  Administered 2016-12-20: 1.5 mg via TRANSDERMAL
  Filled 2016-12-20: qty 1

## 2016-12-20 MED ORDER — ONDANSETRON HCL 4 MG/2ML IJ SOLN
INTRAMUSCULAR | Status: DC | PRN
  Administered 2016-12-20: 4 mg via INTRAVENOUS

## 2016-12-20 MED ORDER — DIPHENHYDRAMINE HCL 25 MG PO CAPS: 25.0000 mg | ORAL_CAPSULE | Freq: Four times a day (QID) | ORAL | Status: DC | PRN

## 2016-12-20 MED ORDER — METOCLOPRAMIDE HCL 5 MG/ML IJ SOLN: 10.0000 mg | Freq: Once | INTRAMUSCULAR | Status: DC | PRN

## 2016-12-20 MED ORDER — LACTATED RINGERS IV SOLN
INTRAVENOUS | Status: DC
  Administered 2016-12-20: 12:00:00 via INTRAVENOUS

## 2016-12-20 MED ORDER — OXYTOCIN 40 UNITS IN LACTATED RINGERS INFUSION - SIMPLE MED: 2.5000 [IU]/h | INTRAVENOUS | Status: AC

## 2016-12-20 MED ORDER — DEXAMETHASONE SODIUM PHOSPHATE 10 MG/ML IJ SOLN
INTRAMUSCULAR | Status: DC | PRN
  Administered 2016-12-20: 10 mg via INTRAVENOUS

## 2016-12-20 MED ORDER — WITCH HAZEL-GLYCERIN EX PADS: 1.0000 "application " | MEDICATED_PAD | CUTANEOUS | Status: DC | PRN

## 2016-12-20 MED ORDER — MENTHOL 3 MG MT LOZG: 1.0000 | LOZENGE | OROMUCOSAL | Status: DC | PRN

## 2016-12-20 MED ORDER — DIBUCAINE 1 % RE OINT: 1.0000 "application " | TOPICAL_OINTMENT | RECTAL | Status: DC | PRN

## 2016-12-20 MED ORDER — TETANUS-DIPHTH-ACELL PERTUSSIS 5-2.5-18.5 LF-MCG/0.5 IM SUSP: 0.5000 mL | Freq: Once | INTRAMUSCULAR | Status: DC

## 2016-12-20 MED ORDER — FENTANYL CITRATE (PF) 100 MCG/2ML IJ SOLN
INTRAMUSCULAR | Status: DC | PRN
  Administered 2016-12-20: 20 ug via INTRATHECAL

## 2016-12-20 MED ORDER — SIMETHICONE 80 MG PO CHEW
80.0000 mg | CHEWABLE_TABLET | Freq: Three times a day (TID) | ORAL | Status: DC
  Administered 2016-12-21 – 2016-12-22 (×4): 80 mg via ORAL
  Filled 2016-12-20 (×5): qty 1

## 2016-12-20 MED ORDER — KETOROLAC TROMETHAMINE 30 MG/ML IJ SOLN: 30.0000 mg | Freq: Four times a day (QID) | INTRAMUSCULAR | Status: DC | PRN

## 2016-12-20 MED ORDER — NALOXONE HCL 2 MG/2ML IJ SOSY
1.0000 ug/kg/h | PREFILLED_SYRINGE | INTRAVENOUS | Status: DC | PRN
  Filled 2016-12-20: qty 2

## 2016-12-20 MED ORDER — ACETAMINOPHEN 325 MG PO TABS: 650.0000 mg | ORAL_TABLET | ORAL | Status: DC | PRN

## 2016-12-20 MED ORDER — MORPHINE SULFATE (PF) 0.5 MG/ML IJ SOLN
INTRAMUSCULAR | Status: AC
  Filled 2016-12-20: qty 10

## 2016-12-20 MED ORDER — SIMETHICONE 80 MG PO CHEW
80.0000 mg | CHEWABLE_TABLET | ORAL | Status: DC
  Administered 2016-12-22: 80 mg via ORAL
  Filled 2016-12-20 (×2): qty 1

## 2016-12-20 MED ORDER — DEXAMETHASONE SODIUM PHOSPHATE 10 MG/ML IJ SOLN
INTRAMUSCULAR | Status: AC
  Filled 2016-12-20: qty 1

## 2016-12-20 MED ORDER — PRENATAL MULTIVITAMIN CH
1.0000 | ORAL_TABLET | Freq: Every day | ORAL | Status: DC
  Administered 2016-12-21 – 2016-12-22 (×2): 1 via ORAL
  Filled 2016-12-20 (×2): qty 1

## 2016-12-20 MED ORDER — FENTANYL CITRATE (PF) 100 MCG/2ML IJ SOLN
25.0000 ug | INTRAMUSCULAR | Status: DC | PRN
Start: 2016-12-20 — End: 2016-12-20

## 2016-12-20 MED ORDER — OXYTOCIN 10 UNIT/ML IJ SOLN
INTRAMUSCULAR | Status: AC
  Filled 2016-12-20: qty 4

## 2016-12-20 MED ORDER — MEPERIDINE HCL 25 MG/ML IJ SOLN: 6.2500 mg | INTRAMUSCULAR | Status: DC | PRN

## 2016-12-20 SURGICAL SUPPLY — 41 items
APL SKNCLS STERI-STRIP NONHPOA (GAUZE/BANDAGES/DRESSINGS) ×1
BENZOIN TINCTURE PRP APPL 2/3 (GAUZE/BANDAGES/DRESSINGS) ×2 IMPLANT
CHLORAPREP W/TINT 26ML (MISCELLANEOUS) ×2 IMPLANT
CLAMP CORD UMBIL (MISCELLANEOUS) ×2 IMPLANT
CLOTH BEACON ORANGE TIMEOUT ST (SAFETY) ×2 IMPLANT
DRSG OPSITE POSTOP 4X10 (GAUZE/BANDAGES/DRESSINGS) ×2 IMPLANT
ELECT REM PT RETURN 9FT ADLT (ELECTROSURGICAL) ×2
ELECTRODE REM PT RTRN 9FT ADLT (ELECTROSURGICAL) ×1 IMPLANT
EXTRACTOR VACUUM KIWI (MISCELLANEOUS) IMPLANT
GLOVE BIO SURGEON ST LM GN SZ9 (GLOVE) ×2 IMPLANT
GLOVE BIOGEL PI IND STRL 7.0 (GLOVE) ×1 IMPLANT
GLOVE BIOGEL PI IND STRL 9 (GLOVE) ×2 IMPLANT
GLOVE BIOGEL PI INDICATOR 7.0 (GLOVE) ×1
GLOVE BIOGEL PI INDICATOR 9 (GLOVE) ×2
GOWN STRL REUS W/TWL 2XL LVL3 (GOWN DISPOSABLE) ×2 IMPLANT
GOWN STRL REUS W/TWL LRG LVL3 (GOWN DISPOSABLE) ×2 IMPLANT
HEMOSTAT ARISTA ABSORB 3G PWDR (MISCELLANEOUS) ×2 IMPLANT
NEEDLE HYPO 25X5/8 SAFETYGLIDE (NEEDLE) IMPLANT
NS IRRIG 1000ML POUR BTL (IV SOLUTION) ×2 IMPLANT
PACK C SECTION WH (CUSTOM PROCEDURE TRAY) ×2 IMPLANT
PAD ABD 8X10 STRL (GAUZE/BANDAGES/DRESSINGS) ×2 IMPLANT
PAD OB MATERNITY 4.3X12.25 (PERSONAL CARE ITEMS) ×2 IMPLANT
PENCIL SMOKE EVAC W/HOLSTER (ELECTROSURGICAL) ×2 IMPLANT
RTRCTR C-SECT PINK 25CM LRG (MISCELLANEOUS) ×2 IMPLANT
RTRCTR C-SECT PINK 34CM XLRG (MISCELLANEOUS) IMPLANT
SPONGE GAUZE 4X4 12PLY (GAUZE/BANDAGES/DRESSINGS) ×4 IMPLANT
STRIP CLOSURE SKIN 1/2X4 (GAUZE/BANDAGES/DRESSINGS) ×2 IMPLANT
SUT MNCRL 0 VIOLET CTX 36 (SUTURE) ×4 IMPLANT
SUT MONOCRYL 0 CTX 36 (SUTURE) ×4
SUT PDS AB 0 CT1 27 (SUTURE) ×2 IMPLANT
SUT PLAIN 2 0 (SUTURE) ×3
SUT PLAIN ABS 2-0 CT1 27XMFL (SUTURE) ×3 IMPLANT
SUT VIC AB 0 CT1 27 (SUTURE) ×2
SUT VIC AB 0 CT1 27XBRD ANBCTR (SUTURE) ×1 IMPLANT
SUT VIC AB 2-0 CT1 27 (SUTURE) ×2
SUT VIC AB 2-0 CT1 TAPERPNT 27 (SUTURE) ×1 IMPLANT
SUT VIC AB 4-0 KS 27 (SUTURE) ×2 IMPLANT
SYR BULB IRRIGATION 50ML (SYRINGE) IMPLANT
TAPE PAPER 2X10 WHT MICROPORE (GAUZE/BANDAGES/DRESSINGS) ×2 IMPLANT
TOWEL OR 17X24 6PK STRL BLUE (TOWEL DISPOSABLE) ×2 IMPLANT
TRAY FOLEY BAG SILVER LF 14FR (SET/KITS/TRAYS/PACK) ×2 IMPLANT

## 2016-12-20 NOTE — Addendum Note (Signed)
Addendum  created 12/20/16 1543 by Flossie Dibble, CRNA   Sign clinical note

## 2016-12-20 NOTE — Anesthesia Preprocedure Evaluation (Signed)
Anesthesia Evaluation  Patient identified by MRN, date of birth, ID band Patient awake    Reviewed: Allergy & Precautions, NPO status , Patient's Chart, lab work & pertinent test results  Airway Mallampati: II  TM Distance: >3 FB Neck ROM: Full    Dental no notable dental hx. (+) Teeth Intact, Caps,    Pulmonary neg pulmonary ROS,    Pulmonary exam normal breath sounds clear to auscultation       Cardiovascular negative cardio ROS Normal cardiovascular exam Rhythm:Regular Rate:Normal     Neuro/Psych  Headaches, PSYCHIATRIC DISORDERS    GI/Hepatic negative GI ROS, Neg liver ROS,   Endo/Other  Obesity  Renal/GU negative Renal ROS  negative genitourinary   Musculoskeletal negative musculoskeletal ROS (+)   Abdominal (+) + obese,   Peds  Hematology negative hematology ROS (+)   Anesthesia Other Findings   Reproductive/Obstetrics (+) Pregnancy Previous C/Section Previous Myomectomy                             Anesthesia Physical Anesthesia Plan  ASA: II  Anesthesia Plan: Spinal   Post-op Pain Management:    Induction:   PONV Risk Score and Plan: 4 or greater and Ondansetron, Dexamethasone, Midazolam, Propofol infusion, Scopolamine patch - Pre-op and Metaclopromide  Airway Management Planned: Natural Airway  Additional Equipment:   Intra-op Plan:   Post-operative Plan:   Informed Consent: I have reviewed the patients History and Physical, chart, labs and discussed the procedure including the risks, benefits and alternatives for the proposed anesthesia with the patient or authorized representative who has indicated his/her understanding and acceptance.     Plan Discussed with: Anesthesiologist, CRNA and Surgeon  Anesthesia Plan Comments:         Anesthesia Quick Evaluation

## 2016-12-20 NOTE — Interval H&P Note (Signed)
History and Physical Interval Note:  12/20/2016 10:46 AM  Julie Robbins  has presented today for surgery, with the diagnosis of repeat c-section  The various methods of treatment have been discussed with the patient and family. After consideration of risks, benefits and other options for treatment, the patient has consented to  Procedure(s): REPEAT CESAREAN SECTION (N/A) as a surgical intervention .  The patient's history has been reviewed, patient examined, no change in status, stable for surgery.  I have reviewed the patient's chart and labs.  Questions were answered to the patient's satisfaction.     Julie Robbins

## 2016-12-20 NOTE — Anesthesia Procedure Notes (Signed)
Spinal  Patient location during procedure: OR Start time: 12/20/2016 11:13 AM Staffing Anesthesiologist: Josephine Igo Performed: anesthesiologist  Preanesthetic Checklist Completed: patient identified, site marked, surgical consent, pre-op evaluation, timeout performed, IV checked, risks and benefits discussed and monitors and equipment checked Spinal Block Patient position: sitting Prep: site prepped and draped and DuraPrep Patient monitoring: heart rate, cardiac monitor, continuous pulse ox and blood pressure Approach: midline Location: L3-4 Injection technique: single-shot Needle Needle type: Pencan  Needle gauge: 24 G Needle length: 9 cm Needle insertion depth: 5 cm Assessment Sensory level: T4 Events: paresthesia Additional Notes Patient tolerated procedure well. Transient paresthesias buttocks. Adequate sensory level.

## 2016-12-20 NOTE — Lactation Note (Signed)
This note was copied from a baby's chart. Lactation Consultation Note  Patient Name: Julie Robbins [id number] Date: 12/20/2016 Reason for consult: Initial assessment Baby at 10 hr of life. Upon entry baby was sts with mom. Mom reports baby has a shallow latch. Reviewed positioning and breast compression. Given coconut oil for the soreness. Discussed baby behavior, feeding frequency, baby belly size, voids, wt loss, breast changes, and nipple care. Mom stated she can manually express, has seen colostrum, and has a spoon at the bedside. Given lactation handouts. Aware of OP services and support group. Mom will offer the breast on demand. She will post express and spoon feed pre volume guidelines as needed. She has lactation phone number to call for latch help.   Maternal Data Has patient been taught Hand Expression?: Yes Does the patient have breastfeeding experience prior to this delivery?: Yes  Feeding Feeding Type: Breast Fed Length of feed: 15 min  LATCH Score                   Interventions    Lactation Tools Discussed/Used     Consult Status Consult Status: Follow-up Date: 12/21/16 Follow-up type: In-patient    Denzil Hughes 12/20/2016, 10:27 PM

## 2016-12-20 NOTE — Anesthesia Postprocedure Evaluation (Signed)
Anesthesia Post Note  Patient: Julie Robbins  Procedure(s) Performed: Procedure(s) (LRB): REPEAT CESAREAN SECTION (N/A)     Patient location during evaluation: PACU Anesthesia Type: Spinal Level of consciousness: oriented and awake and alert Pain management: pain level controlled Vital Signs Assessment: post-procedure vital signs reviewed and stable Respiratory status: spontaneous breathing, respiratory function stable and nonlabored ventilation Cardiovascular status: blood pressure returned to baseline and stable Postop Assessment: no headache, no backache, no apparent nausea or vomiting, spinal receding and patient able to bend at knees Anesthetic complications: no    Last Vitals:  Vitals:   12/20/16 1355 12/20/16 1401  BP:  (!) 105/54  Pulse: (!) 55 (!) 54  Resp: 15 14  Temp:    SpO2: 97% 98%    Last Pain:  Vitals:   12/20/16 1345  TempSrc:   PainSc: 2    Pain Goal:                 Melquiades Kovar A.

## 2016-12-20 NOTE — Transfer of Care (Signed)
Immediate Anesthesia Transfer of Care Note  Patient: Julie Robbins  Procedure(s) Performed: Procedure(s): REPEAT CESAREAN SECTION (N/A)  Patient Location: PACU  Anesthesia Type:Spinal  Level of Consciousness: awake  Airway & Oxygen Therapy: Patient Spontanous Breathing  Post-op Assessment: Report given to RN  Post vital signs: Reviewed and stable  Last Vitals:  Vitals:   12/20/16 0952  BP: 112/73  Pulse: 81  Resp: 20  Temp: 36.8 C    Last Pain:  Vitals:   12/20/16 0952  TempSrc: Oral         Complications: No apparent anesthesia complications

## 2016-12-20 NOTE — Anesthesia Postprocedure Evaluation (Signed)
Anesthesia Post Note  Patient: Julie Robbins  Procedure(s) Performed: Procedure(s) (LRB): REPEAT CESAREAN SECTION (N/A)     Patient location during evaluation: Mother Baby Anesthesia Type: Spinal Level of consciousness: awake and alert and oriented Pain management: satisfactory to patient Vital Signs Assessment: post-procedure vital signs reviewed and stable Respiratory status: respiratory function stable and spontaneous breathing Cardiovascular status: blood pressure returned to baseline Postop Assessment: no headache, no backache, spinal receding, patient able to bend at knees and adequate PO intake Anesthetic complications: no    Last Vitals:  Vitals:   12/20/16 1405 12/20/16 1426  BP:  (!) 99/50  Pulse: (!) 53 (!) 53  Resp: 15 20  Temp:  37.1 C  SpO2: 95%     Last Pain:  Vitals:   12/20/16 1426  TempSrc: Oral  PainSc:    Pain Goal:                 [name]

## 2016-12-20 NOTE — Brief Op Note (Signed)
12/20/2016  1:35 PM  PATIENT:  Julie Robbins  36 y.o. female  PRE-OPERATIVE DIAGNOSIS:  repeat c-section pregnancy 39 weeks. Prior cesarean section 1 prior multiple myomectomy 2  POST-OPERATIVE DIAGNOSIS:  repeat c-section pregnancy [redacted] weeks gestation prior cesarean section 1 prior multiple myomectomy 2  PROCEDURE:  Procedure(s): REPEAT CESAREAN SECTION (N/A) low transverse  SURGEON:  Surgeon(s) and Role:    * [name], MD - Primary  PHYSICIAN ASSISTANT: Aeronautical engineer RNFA  ASSISTANTS: none   ANESTHESIA:   spinal  EBL:  Total I/O In: 2400 [I.V.:2400] Out: 1164 [Urine:350; Blood:814]  BLOOD ADMINISTERED:none  DRAINS: Urinary Catheter (Foley)   LOCAL MEDICATIONS USED:  NONE  SPECIMEN:  Source of Specimen:  Placenta to labor and delivery  DISPOSITION OF SPECIMEN:  N/A  COUNTS:  YES  TOURNIQUET:  * No tourniquets in log *  DICTATION: .Dragon Dictation  PLAN OF CARE: Admit to inpatient   PATIENT DISPOSITION:  PACU - hemodynamically stable.   Delay start of Pharmacological VTE agent (>24hrs) due to surgical blood loss or risk of bleeding: not applicable Details of procedure: Patient was taken operating room prepped and draped with spinal anesthesia and placed abdomen prepped and draped in timeout conducted. Foley catheter was in place. Ancef was administered 2 g. Midline vertical abdominal incision was repeated with excision of a 2 cm wide strip of the old distended keloid. Sharp dissection to the fascia was followed by entry the peritoneal cavity without difficulty. No adhesions to the uterus which was quite large. Bladder flap was developed with some bleeding in the area due to prior adhesions and prior surgery. Transverse incision was made through an extremely thick lower uterine segment identifying the fetal vertex which was guided into the incision and delivered with fundal pressure. The cord was clamped at 1 minute, cord blood and gases obtained  and the placenta delivered after uterine massage and IV oxytocin used. Uterus was cleaned. The anterior uterine wall was incredibly thick, at least 6 cm in thickness the uterus was swabbed out and the anterior uterine wall closed in 2 layer closure, continuous running closure of each layer, and then a figure-of-eight suture required on the left side to achieve adequate hemostasis. Point cautery was used as necessary. Hemostasis was still less than ideal due to some thin oozing sites which were required placement of starch based coagulation agent in the bladder flap area. The anterior peritoneum was is closed using running 2-0 Vicryl followed by 0 PDS closure of the fascia, interrupted or as an mattress sutures of 20 plain closure of the subcutaneous fatty tissues and then subcuticular 4-0 Vicryl closure and skin incisions. Sponge and needle counts were correct. Patient recovery in stable condition with EBL 750 cc and urine remained clear

## 2016-12-20 NOTE — Op Note (Signed)
Please see the brief operative note for surgical details 

## 2016-12-21 DIAGNOSIS — Z3A39 39 weeks gestation of pregnancy: Secondary | ICD-10-CM

## 2016-12-21 DIAGNOSIS — O34211 Maternal care for low transverse scar from previous cesarean delivery: Secondary | ICD-10-CM

## 2016-12-21 LAB — CBC
HCT: 31.5 % — ABNORMAL LOW (ref 36.0–46.0)
Hemoglobin: 11.3 g/dL — ABNORMAL LOW (ref 12.0–15.0)
MCH: 31.7 pg (ref 26.0–34.0)
MCHC: 35.9 g/dL (ref 30.0–36.0)
MCV: 88.5 fL (ref 78.0–100.0)
PLATELETS: 146 10*3/uL — AB (ref 150–400)
RBC: 3.56 MIL/uL — ABNORMAL LOW (ref 3.87–5.11)
RDW: 13.4 % (ref 11.5–15.5)
WBC: 11.3 10*3/uL — ABNORMAL HIGH (ref 4.0–10.5)

## 2016-12-21 LAB — BIRTH TISSUE RECOVERY COLLECTION (PLACENTA DONATION)

## 2016-12-21 NOTE — Progress Notes (Addendum)
error 

## 2016-12-21 NOTE — Progress Notes (Signed)
Subjective: Postpartum Day #1: Cesarean Delivery Patient reports tolerating PO and no problems voiding.  Denies dizziness with ambulation. Breastfeeding going well; undecided on contraception.  Objective: Vital signs in last 24 hours: Temp:  [97.6 F (36.4 C)-98.9 F (37.2 C)] 98.4 F (36.9 C) (09/13 2112) Pulse Rate:  [52-76] 52 (09/14 0700) Resp:  [12-20] 18 (09/14 0700) BP: (92-106)/(44-78) 98/61 (09/14 0650) SpO2:  [95 %-100 %] 98 % (09/13 1745)  Physical Exam:  General: alert, cooperative and no distress Lochia: appropriate Uterine Fundus: firm Incision: vertical abd incision, pressure dsg intact; lower half of honeycomb with small sang drainage DVT Evaluation: No evidence of DVT seen on physical exam.   Recent Labs  12/19/16 1035 12/21/16 0504  HGB 12.1 11.3*  HCT 34.6* 31.5*    Assessment/Plan: Status post Cesarean section. Doing well postoperatively.  Continue current care. Anticipate d/c 9/15  SHAW, KIMBERLY CNM 12/21/2016, 12:34 PM

## 2016-12-22 MED ORDER — OXYCODONE-ACETAMINOPHEN 10-325 MG PO TABS
1.0000 | ORAL_TABLET | Freq: Four times a day (QID) | ORAL | 0 refills | Status: AC | PRN
Start: 2016-12-22 — End: 2017-12-22

## 2016-12-22 MED ORDER — IBUPROFEN 600 MG PO TABS: 600.0000 mg | ORAL_TABLET | Freq: Four times a day (QID) | ORAL | 0 refills | Status: DC | PRN

## 2016-12-22 NOTE — Discharge Instructions (Signed)

## 2016-12-22 NOTE — Discharge Summary (Signed)
OB Discharge Summary     Patient Name: Julie Robbins DOB: Sep 18, 1980 MRN: [id number]  Date of admission: 12/20/2016 Delivering MD: [name]   Date of discharge: 12/22/2016  Admitting diagnosis: repeat c-section Intrauterine pregnancy: [redacted]w[redacted]d     Secondary diagnosis:  Active Problems:   History of cesarean section   Status post repeat low transverse cesarean section  Additional problems: hx myomectomies x 2; situational depression; GBS pos     Discharge diagnosis: Term Pregnancy Delivered                                                                                                Post partum procedures:none  Augmentation: N/A  Complications: None  Hospital course:  Sceduled C/S   36 y.o. yo G2P2002 at [redacted]w[redacted]d was admitted to the hospital 12/20/2016 for scheduled cesarean section with the following indication:Elective Repeat.  Membrane Rupture Time/Date: 11:30 AM ,12/20/2016   Patient delivered a Viable infant.12/20/2016  Details of operation can be found in separate operative note.  Pateint had an uncomplicated postpartum course.  She is ambulating, tolerating a regular diet, passing flatus, and urinating well. Patient is discharged home in stable condition on  12/22/16         Physical exam  Vitals:   12/21/16 0650 12/21/16 0700 12/21/16 1812 12/22/16 0610  BP: 98/61  (!) 112/49 111/64  Pulse: (!) 57 (!) 52 61 64  Resp: 18 18 20 18   Temp:   98.5 F (36.9 C) 98.4 F (36.9 C)  TempSrc:   Oral Oral  SpO2:   98% 99%  Weight:      Height:       General: alert and cooperative Lochia: appropriate Uterine Fundus: firm Incision: vert incision- honeycomb with sm drainage DVT Evaluation: No evidence of DVT seen on physical exam. Labs: Lab Results  Component Value Date   WBC 11.3 (H) 12/21/2016   HGB 11.3 (L) 12/21/2016   HCT 31.5 (L) 12/21/2016   MCV 88.5 12/21/2016   PLT 146 (L) 12/21/2016   CMP Latest Ref Rng & Units 07/11/2007  Glucose 70 - 99 mg/dL 90   BUN 6 - 23 mg/dL 13  Creatinine 0.40 - 1.20 mg/dL 0.65  Sodium 135 - 145 meq/L 138  Potassium 3.5 - 5.3 meq/L 4.2  Chloride 96 - 112 meq/L 105  CO2 19 - 32 meq/L 20  Calcium 8.4 - 10.5 mg/dL 9.1  Total Protein 6.0 - 8.3 g/dL 7.6  Total Bilirubin 0.3 - 1.2 mg/dL 0.3  Alkaline Phos 39 - 117 units/L 48  AST 0 - 37 units/L 16  ALT 0 - 35 units/L 10    Discharge instruction: per After Visit Summary and "Baby and Me Booklet".  After visit meds:  Allergies as of 12/22/2016      Reactions   Doxycycline Hyclate    REACTION: Lip swelling      Medication List    TAKE these medications   ibuprofen 600 MG tablet Commonly known as:  ADVIL,MOTRIN Take 1 tablet (600 mg total) by mouth every 6 (six) hours as needed.  oxyCODONE-acetaminophen 10-325 MG tablet Commonly known as:  PERCOCET Take 1 tablet by mouth every 6 (six) hours as needed for pain.   PRENATAL VITAMIN PO Take 1 tablet by mouth at bedtime.   VITAMIN D3 SUPER STRENGTH 2000 units Tabs Generic drug:  Cholecalciferol Take 2,000 Units by mouth at bedtime.            Discharge Care Instructions        Start     Ordered   12/22/16 0000  ibuprofen (ADVIL,MOTRIN) 600 MG tablet  Every 6 hours PRN    Question:  Supervising Provider  Answer:  Aletha Halim   12/22/16 0757   12/22/16 0000  oxyCODONE-acetaminophen (PERCOCET) 10-325 MG tablet  Every 6 hours PRN    Question:  Supervising Provider  Answer:  Aletha Halim   12/22/16 2446   12/22/16 0000  Discharge patient    Question Answer Comment  Discharge disposition 01-Home or Self Care   Discharge patient date 12/22/2016      12/22/16 0757      Diet: routine diet  Activity: Advance as tolerated. Pelvic rest for 6 weeks.   Outpatient follow KM:MNOT week as scheduled Follow up Appt:Future Appointments Date Time Provider [place]  12/27/2016 10:00 AM [name], MD [facility]  12/27/2016 10:15 AM [name], MD [facility]   Follow up Visit:No Follow-up on file.  Postpartum contraception: Undecided  Newborn Data: Live born female  Birth Weight: 7 lb 2.8 oz (3255 g) APGAR: 8, 8  Baby Feeding: Breast Disposition:home with mother   12/22/2016 Serita Grammes, CNM 12/22/2016

## 2016-12-22 NOTE — Lactation Note (Signed)
This note was copied from a baby's chart. Lactation Consultation Note  Patient Name: Julie Robbins [id number] Date: 12/22/2016  baby 57 hours  LC reviewed and updated doc flow sheets.  8% weight loss, baby has been consistent at the breast, latch score of 8-9  Voids and stools QS. Per mom breast are full and warm, and hearing more swallows.  When [name] released nipple well rounded, nipples clear, no breakdown noted.  LC discussed with mom feels the soreness is transient, and also due to milk being in.  Some areola edema noted. LC instructed mom on the use shells , hand pump and comfort  Gels. Increased flange to #27 for when the milk comes in.  Sore nipple and engorgement prevention and tx reviewed. Per mom has  DEBP at home. Mother informed of post-discharge support and given phone number to the lactation department, including services for phone call assistance; out-patient appointments; and breastfeeding support group. List of other breastfeeding resources in the community given in the handout. Encouraged mother to call for problems or concerns related to breastfeeding.   Maternal Data    Feeding Feeding Type: Breast Fed Length of feed: 25 min  LATCH Score Latch: Grasps breast easily, tongue down, lips flanged, rhythmical sucking.  Audible Swallowing: Spontaneous and intermittent  Type of Nipple: Everted at rest and after stimulation  Comfort (Breast/Nipple): Filling, red/small blisters or bruises, mild/mod discomfort  Hold (Positioning): No assistance needed to correctly position infant at breast.  LATCH Score: 9  Interventions    Lactation Tools Discussed/Used     Consult Status      [name] 12/22/2016, 11:30 AM

## 2016-12-22 NOTE — Progress Notes (Signed)
Drainage noted on honeycomb dressing. Marked by this RN & will con't to monitor.

## 2016-12-27 ENCOUNTER — Ambulatory Visit: Payer: BLUE CROSS/BLUE SHIELD | Admitting: Obstetrics and Gynecology

## 2016-12-27 ENCOUNTER — Ambulatory Visit (INDEPENDENT_AMBULATORY_CARE_PROVIDER_SITE_OTHER): Payer: BLUE CROSS/BLUE SHIELD | Admitting: Obstetrics and Gynecology

## 2016-12-27 ENCOUNTER — Encounter: Payer: Self-pay | Admitting: Obstetrics and Gynecology

## 2016-12-27 DIAGNOSIS — Z9889 Other specified postprocedural states: Secondary | ICD-10-CM

## 2016-12-27 NOTE — Progress Notes (Signed)
Patient ID: Julie Robbins, female   DOB: 1981-04-05, 36 y.o.   MRN: [id number]  Subjective:  Julie Robbins is a 36 y.o. female who presents for a 7 days postpartum visit  Patient concerns: No complaints Prenatal and intrapartum course notable for repeat c-section, uterus exteremly large even after the deliverly   Patient is not sexually active.   The following portions of the patient's history were reviewed and updated as appropriate: allergies, current medications, past family history, past medical history, past surgical history and problem list.  Review of Systems    See Subjective, otherwise negative ROS.  Objective:  BP 120/60   Pulse 72   Wt 179 lb 3.2 oz (81.3 kg)   LMP 03/28/2016 (Exact Date)   BMI 30.76 kg/m   General:  alert, cooperative and no distress     Lungs: clear to auscultation bilaterally  Heart:  regular rate and rhythm, S1, S2 normal, no murmur  Abdomen: soft, non-tender; bowel sounds normal; no masses,  no organomegaly. Incision well healed. steri strips in place   Vulva:  normal  Vagina: normal vagina  Cervix:  Normal, still firm, u +1  Corpus: normal size, contour, position, consistency, mobility, non-tender  Adnexa:  normal adnexa          Assessment:  1.  postpartum exam.  2. Contraception: None 3. C-section incision healing well  Plan:  1. Consider hysterectomy in the future 2. F/U in 4 weeks    By signing my name below, I, Margit Banda, attest that this documentation has been prepared under the direction and in the presence of [name], MD. Electronically Signed: Margit Banda, Medical Scribe. 12/27/16. 10:44 AM.  I personally performed the services described in this documentation, which was SCRIBED in my presence. The recorded information has been reviewed and considered accurate. It has been edited as necessary during review. [name], MD

## 2017-01-21 ENCOUNTER — Ambulatory Visit: Payer: BLUE CROSS/BLUE SHIELD | Admitting: Obstetrics and Gynecology

## 2017-01-21 ENCOUNTER — Ambulatory Visit: Payer: BLUE CROSS/BLUE SHIELD | Admitting: Women's Health

## 2017-01-28 ENCOUNTER — Encounter: Payer: Self-pay | Admitting: Obstetrics and Gynecology

## 2017-01-28 ENCOUNTER — Ambulatory Visit (INDEPENDENT_AMBULATORY_CARE_PROVIDER_SITE_OTHER): Payer: BLUE CROSS/BLUE SHIELD | Admitting: Obstetrics and Gynecology

## 2017-01-28 NOTE — Progress Notes (Signed)
Patient ID: Julie Robbins, female   DOB: Oct 13, 1980, 36 y.o.   MRN: [id number]   Subjective:  Julie Robbins is a 36 y.o. female who presents for a 5 week postpartum visit.   Patient concerns: Pt reports that she is still intermittently bleeding and notes that it has recently become heavier. She is currently breastfeeding. She reports normal BM. Otherwise pt denies any symptoms or any other complaints at this time.   Prenatal and intrapartum course notable for repeat c-section, uterus exteremly large even after delivery.    Patient is not sexually active.   The following portions of the patient's history were reviewed and updated as appropriate: allergies, current medications, past family history, past medical history, past surgical history and problem list.  Review of Systems    See Subjective, otherwise negative ROS.  Objective:  BP 110/60 (BP Location: Right Arm, Patient Position: Sitting, Cuff Size: Normal)   Pulse 75   Ht 5\' 4"  (1.626 m)   Wt 163 lb (73.9 kg)   BMI 27.98 kg/m   General:  alert, cooperative and no distress     Lungs: clear to auscultation bilaterally  Heart:  regular rate and rhythm, S1, S2 normal, no murmur  Abdomen: soft, non-tender; bowel sounds normal; no masses,  no organomegaly   Vulva:  normal  Vagina: normal vagina, good length  Cervix:  Normal, significantly deviated to the right  Uterus: normal size, contour, position, consistency, mobility, non-tender, 10 week uterine fibroid   Adnexa:  normal adnexa          Assessment:  1. postpartum exam.  2. Contraception: none, considering IUD 3. C-section incision well healed 4. Uterine fibroids 10 weeks  Plan:  1. Pt will call by 02/21/2017 for a Rx Megace if she is still bleeding 2. Otherwise, F/U 1 year GYN exam 3. Consider hysterectomy in the future, late 30 years or 40's if enlargement of fibroids reoccurs. 4. Consider IUD short term.  By signing my name below, I, Margit Banda,  attest that this documentation has been prepared under the direction and in the presence of [name], MD. Electronically Signed: Margit Banda, Medical Scribe. 01/28/17. 1:52 PM.  I personally performed the services described in this documentation, which was SCRIBED in my presence. The recorded information has been reviewed and considered accurate. It has been edited as necessary during review. [name], MD

## 2017-04-15 DIAGNOSIS — J029 Acute pharyngitis, unspecified: Secondary | ICD-10-CM | POA: Diagnosis not present

## 2017-10-23 DIAGNOSIS — F4322 Adjustment disorder with anxiety: Secondary | ICD-10-CM | POA: Diagnosis not present

## 2017-11-13 DIAGNOSIS — F4322 Adjustment disorder with anxiety: Secondary | ICD-10-CM | POA: Diagnosis not present

## 2017-12-04 DIAGNOSIS — F4322 Adjustment disorder with anxiety: Secondary | ICD-10-CM | POA: Diagnosis not present

## 2017-12-12 DIAGNOSIS — F4322 Adjustment disorder with anxiety: Secondary | ICD-10-CM | POA: Diagnosis not present

## 2017-12-23 DIAGNOSIS — F4322 Adjustment disorder with anxiety: Secondary | ICD-10-CM | POA: Diagnosis not present

## 2018-01-30 DIAGNOSIS — F4322 Adjustment disorder with anxiety: Secondary | ICD-10-CM | POA: Diagnosis not present

## 2018-02-17 DIAGNOSIS — F4322 Adjustment disorder with anxiety: Secondary | ICD-10-CM | POA: Diagnosis not present

## 2018-02-23 DIAGNOSIS — R300 Dysuria: Secondary | ICD-10-CM | POA: Diagnosis not present

## 2018-02-23 DIAGNOSIS — N390 Urinary tract infection, site not specified: Secondary | ICD-10-CM | POA: Diagnosis not present

## 2018-02-24 DIAGNOSIS — F4322 Adjustment disorder with anxiety: Secondary | ICD-10-CM | POA: Diagnosis not present

## 2018-03-05 DIAGNOSIS — F4322 Adjustment disorder with anxiety: Secondary | ICD-10-CM | POA: Diagnosis not present

## 2018-03-10 DIAGNOSIS — F4322 Adjustment disorder with anxiety: Secondary | ICD-10-CM | POA: Diagnosis not present

## 2018-04-07 DIAGNOSIS — F4322 Adjustment disorder with anxiety: Secondary | ICD-10-CM | POA: Diagnosis not present

## 2018-04-24 DIAGNOSIS — F4322 Adjustment disorder with anxiety: Secondary | ICD-10-CM | POA: Diagnosis not present

## 2018-05-15 DIAGNOSIS — F4322 Adjustment disorder with anxiety: Secondary | ICD-10-CM | POA: Diagnosis not present

## 2018-05-15 DIAGNOSIS — J039 Acute tonsillitis, unspecified: Secondary | ICD-10-CM | POA: Diagnosis not present

## 2018-05-15 DIAGNOSIS — R52 Pain, unspecified: Secondary | ICD-10-CM | POA: Diagnosis not present

## 2018-05-26 DIAGNOSIS — F4322 Adjustment disorder with anxiety: Secondary | ICD-10-CM | POA: Diagnosis not present

## 2018-06-12 DIAGNOSIS — F4322 Adjustment disorder with anxiety: Secondary | ICD-10-CM | POA: Diagnosis not present

## 2018-06-19 DIAGNOSIS — F4322 Adjustment disorder with anxiety: Secondary | ICD-10-CM | POA: Diagnosis not present

## 2018-07-02 DIAGNOSIS — N3090 Cystitis, unspecified without hematuria: Secondary | ICD-10-CM | POA: Diagnosis not present

## 2018-07-02 DIAGNOSIS — R82998 Other abnormal findings in urine: Secondary | ICD-10-CM | POA: Diagnosis not present

## 2018-07-02 DIAGNOSIS — R3129 Other microscopic hematuria: Secondary | ICD-10-CM | POA: Diagnosis not present

## 2018-07-15 DIAGNOSIS — F4322 Adjustment disorder with anxiety: Secondary | ICD-10-CM | POA: Diagnosis not present

## 2018-07-30 ENCOUNTER — Ambulatory Visit: Payer: BLUE CROSS/BLUE SHIELD | Admitting: Internal Medicine

## 2018-07-30 ENCOUNTER — Encounter: Payer: Self-pay | Admitting: Internal Medicine

## 2018-07-30 ENCOUNTER — Other Ambulatory Visit: Payer: Self-pay

## 2018-07-30 VITALS — Temp 97.9°F | Ht 64.0 in | Wt 163.5 lb

## 2018-07-30 DIAGNOSIS — R509 Fever, unspecified: Secondary | ICD-10-CM | POA: Diagnosis not present

## 2018-07-31 DIAGNOSIS — F4322 Adjustment disorder with anxiety: Secondary | ICD-10-CM | POA: Diagnosis not present

## 2018-08-01 ENCOUNTER — Telehealth: Payer: Self-pay

## 2018-08-01 NOTE — Telephone Encounter (Signed)
Pt returned call. Pt is feeling well and hasnt had fevers for days.

## 2018-08-01 NOTE — Telephone Encounter (Signed)
I left the pt a message to call the office back because Dr. Baird Cancer wants to check to see how the pt is feeling and if she has had any fevers.

## 2018-08-05 ENCOUNTER — Encounter: Payer: Self-pay | Admitting: Internal Medicine

## 2018-08-05 NOTE — Progress Notes (Signed)
Virtual Visit via Video   This visit type was conducted due to national recommendations for restrictions regarding the COVID-19 Pandemic (e.g. social distancing) in an effort to limit this patient's exposure and mitigate transmission in our community.  Due to her co-morbid illnesses, this patient is at least at moderate risk for complications without adequate follow up.  This format is felt to be most appropriate for this patient at this time.  All issues noted in this document were discussed and addressed.  A limited physical exam was performed with this format.    This visit type was conducted due to national recommendations for restrictions regarding the COVID-19 Pandemic (e.g. social distancing) in an effort to limit this patient's exposure and mitigate transmission in our community.  Patients identity confirmed using two different identifiers.  This format is felt to be most appropriate for this patient at this time.  All issues noted in this document were discussed and addressed.  No physical exam was performed (except for noted visual exam findings with Video Visits).    Date:  08/05/2018   ID:  Julie Robbins, DOB September 19, 1980, MRN [id number]  Patient Location:  Home  Provider location:   Office    Chief Complaint:  Fever  History of Present Illness:    Julie Robbins is a 38 y.o. female who presents via video conferencing for a telehealth visit today.    The patient does have symptoms concerning for COVID-19 infection (fever, chills, cough, or new shortness of breath).   She presents today for a virtual visit. She preferred this method of contact due to COVID-19 pandemic. She reports that she had a fever yesterday of 100.4.  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 She left work and went to a drive through testing center in [place] and tested NEGATIVE for COVID-19. She  has not taken a tylenol since yesterday. She denies shortness of breath, cough, chills and anosmia. She has a good appetite. She reports that NO ONE in her facility has tested positive to date. She does mention that her son had some diarrhea last week, but this has since resolved. He did not have a fever. She has not experienced any n/v/d. Her job notified the [facility] and she was advised that she may need repeat testing by the [facility] prior to her return to work.     Past Medical History:  Diagnosis Date  . Medical history non-contributory    Past Surgical History:  Procedure Laterality Date  . CESAREAN SECTION N/A 02/11/2014   Procedure: CESAREAN SECTION;  Surgeon: [name], MD;  Location: [facility];  Service: Obstetrics;  Laterality: N/A;  . CESAREAN SECTION N/A 12/20/2016   Procedure: REPEAT CESAREAN SECTION;  Surgeon: [name], MD;  Location: [facility];  Service: Obstetrics;  Laterality: N/A;  . MYOMECTOMY  2006,2011,2014     Current Meds  Medication Sig  . Cholecalciferol (VITAMIN D3 SUPER STRENGTH) 2000 units TABS Take 2,000 Units by mouth at bedtime.  Marland Kitchen ibuprofen (ADVIL,MOTRIN) 600 MG tablet Take 1 tablet (600 mg total) by mouth every 6 (six) hours as needed.  . Prenatal Vit-Fe Sulfate-FA (PRENATAL VITAMIN PO) Take 1 tablet by mouth at bedtime.      Allergies:   Doxycycline hyclate   Social History   Tobacco Use  . Smoking status: Never Smoker  . Smokeless tobacco: Never  Used  Substance Use Topics  . Alcohol use: No  . Drug use: No     Family Hx: The patient's family history includes Cancer in her paternal grandfather; Healthy in her father; Hypertension in her maternal grandmother and mother; Other in her maternal grandmother and mother.  ROS:   Please see the history of present illness.    Review of Systems  Constitutional: Positive for fever.  HENT: Negative for congestion, sinus pain and sore throat.   Respiratory:  Negative.  Negative for cough and shortness of breath.   Cardiovascular: Negative.   Gastrointestinal: Negative.   Neurological: Negative.   Psychiatric/Behavioral: Negative.     All other systems reviewed and are negative.   Labs/Other Tests and Data Reviewed:    Recent Labs: No results found for requested labs within last 8760 hours.   Recent Lipid Panel Lab Results  Component Value Date/Time   CHOL 144 07/26/2007 02:26 AM   TRIG 50 07/26/2007 02:26 AM   HDL 43 07/26/2007 02:26 AM   CHOLHDL 3.3 Ratio 07/26/2007 02:26 AM   LDLCALC 91 07/26/2007 02:26 AM    Wt Readings from Last 3 Encounters:  07/30/18 163 lb 8 oz (74.2 kg)  01/28/17 163 lb (73.9 kg)  12/27/16 179 lb 3.2 oz (81.3 kg)     Exam:    Vital Signs:  Temp 97.9 F (36.6 C) (Oral)   Ht 5\' 4"  (1.626 m)   Wt 163 lb 8 oz (74.2 kg) Comment: pt provided  LMP  (LMP Unknown)   BMI 28.06 kg/m     Physical Exam  Constitutional: She is oriented to person, place, and time and well-developed, well-nourished, and in no distress.  HENT:  Head: Normocephalic and atraumatic.  Neck: Normal range of motion.  Pulmonary/Chest: Effort normal.  Neurological: She is alert and oriented to person, place, and time.  Psychiatric: Affect normal.  Nursing note and vitals reviewed. She is NOT ill appearing.   ASSESSMENT & PLAN:     1. Febrile illness   Patient advised that there is a possibility that this could still be COVID-19 since she tested so early in the course of her symptoms. She is encouraged to stay home for the rest of the week. She is able to work from home. She is advised to check in with me daily and to keep a temperature log. I do think she will need repeat testing prior to going to work, but I am not sure if testing "by the [facility]" is necessary. I plan to consult with [facility] COVID-19 Task Force to determine next steps.   COVID-19 Education: The signs and symptoms of COVID-19 were discussed with the patient and how  to seek care for testing (follow up with PCP or arrange E-visit).  The importance of social distancing was discussed today.  Patient Risk:   After full review of this patients clinical status, I feel that they are at least moderate risk at this time.  Time:   Today, I have spent 12 minutes/ 36 seconds with the patient with telehealth technology discussing above diagnoses.     Medication Adjustments/Labs and Tests Ordered: Current medicines are reviewed at length with the patient today.  Concerns regarding medicines are outlined above.   Tests Ordered: No orders of the defined types were placed in this encounter.   Medication Changes: No orders of the defined types were placed in this encounter.   Disposition:  Follow up prn  Signed, Maximino Greenland, MD

## 2018-08-05 NOTE — Patient Instructions (Signed)
Fever, Adult         A fever is an increase in your body's temperature. It often means a temperature of 100.4F (38C) or higher. Brief mild or moderate fevers often have no long-term effects. They often do not need treatment. Moderate or high fevers may make you feel uncomfortable. Sometimes, they can be a sign of a serious illness or disease. A fever that keeps coming back or that lasts a long time may cause you to lose water in your body (get dehydrated).  You can take your temperature with a thermometer to see if you have a fever. Temperature can change with:   Age.   Time of day.   Where the thermometer is put in the body. Readings may vary when the thermometer is put:  ? In the mouth (oral).  ? In the butt (rectal).  ? In the ear (tympanic).  ? Under the arm (axillary).  ? On the forehead (temporal).  Follow these instructions at home:  Medicines   Take over-the-counter and prescription medicines only as told by your doctor. Follow the dosing instructions carefully.   If you were prescribed an antibiotic medicine, take it as told by your doctor. Do not stop taking it even if you start to feel better.  General instructions   Watch for any changes in your symptoms. Tell your doctor about them.   Rest as needed.   Drink enough fluid to keep your pee (urine) pale yellow.   Sponge yourself or bathe with room-temperature water as needed. This helps to lower your body temperature. Do not use ice water.   Do not use too many blankets or wear clothes that are too heavy.   If your fever was caused by an infection that spreads from person to person (is contagious), such as a cold or the flu:  ? You should stay home from work and public places for at least 24 hours after your fever is gone.  ? Your fever should be gone for at least 24 hours without the need to use medicines.  Contact a doctor if:   You throw up (vomit).   You cannot eat or drink without throwing up.   You have watery poop (diarrhea).   It  hurts when you pee.   Your symptoms do not get better with treatment.   You have new symptoms.   You feel very weak.  Get help right away if:   You are short of breath or have trouble breathing.   You are dizzy or you pass out (faint).   You feel mixed up (confused).   You have signs of not having enough water in your body, such as:  ? Dark pee, very little pee, or no pee.  ? Cracked lips.  ? Dry mouth.  ? Sunken eyes.  ? Sleepiness.  ? Weakness.   You have very bad pain in your belly (abdomen).   You keep throwing up or having watery poop.   You have a rash on your skin.   Your symptoms get worse all of a sudden.  Summary   A fever is an increase in your body's temperature. It often means a temperature of 100.4F (38C) or higher.   Watch for any changes in your symptoms. Tell your doctor about them.   Take all medicines only as told by your doctor.   Do not go to work or other public places if your fever was caused by an illness that can spread   to other people.   Get help right away if you have signs that you do not have enough water in your body.  This information is not intended to replace advice given to you by your health care provider. Make sure you discuss any questions you have with your health care provider.  Document Released: 01/03/2008 Document Revised: 09/09/2017 Document Reviewed: 09/09/2017  Elsevier Interactive Patient Education  2019 Elsevier Inc[employer]

## 2018-08-14 DIAGNOSIS — N3001 Acute cystitis with hematuria: Secondary | ICD-10-CM | POA: Diagnosis not present

## 2018-08-14 DIAGNOSIS — F4322 Adjustment disorder with anxiety: Secondary | ICD-10-CM | POA: Diagnosis not present

## 2018-08-14 DIAGNOSIS — N3000 Acute cystitis without hematuria: Secondary | ICD-10-CM | POA: Diagnosis not present

## 2018-09-04 DIAGNOSIS — F4322 Adjustment disorder with anxiety: Secondary | ICD-10-CM | POA: Diagnosis not present

## 2018-09-22 DIAGNOSIS — F4322 Adjustment disorder with anxiety: Secondary | ICD-10-CM | POA: Diagnosis not present

## 2018-10-06 DIAGNOSIS — F4322 Adjustment disorder with anxiety: Secondary | ICD-10-CM | POA: Diagnosis not present

## 2018-10-20 ENCOUNTER — Ambulatory Visit: Payer: BLUE CROSS/BLUE SHIELD | Admitting: Obstetrics and Gynecology

## 2018-10-20 DIAGNOSIS — F4322 Adjustment disorder with anxiety: Secondary | ICD-10-CM | POA: Diagnosis not present

## 2018-10-31 ENCOUNTER — Ambulatory Visit: Payer: BLUE CROSS/BLUE SHIELD | Admitting: Obstetrics and Gynecology

## 2018-11-03 DIAGNOSIS — F4322 Adjustment disorder with anxiety: Secondary | ICD-10-CM | POA: Diagnosis not present

## 2018-11-17 DIAGNOSIS — F4322 Adjustment disorder with anxiety: Secondary | ICD-10-CM | POA: Diagnosis not present

## 2018-12-01 ENCOUNTER — Other Ambulatory Visit: Payer: Self-pay

## 2018-12-01 ENCOUNTER — Ambulatory Visit: Payer: BLUE CROSS/BLUE SHIELD | Admitting: Obstetrics and Gynecology

## 2018-12-01 ENCOUNTER — Encounter: Payer: Self-pay | Admitting: Obstetrics and Gynecology

## 2018-12-01 VITALS — BP 134/79 | HR 61 | Ht 64.0 in | Wt 160.4 lb

## 2018-12-01 DIAGNOSIS — Z86018 Personal history of other benign neoplasm: Secondary | ICD-10-CM

## 2018-12-01 DIAGNOSIS — D259 Leiomyoma of uterus, unspecified: Secondary | ICD-10-CM

## 2018-12-01 NOTE — Progress Notes (Signed)
Patient ID: SHALE MCGANN, female   DOB: [birth year], 38 y.o.   MRN: [id number]   [facility] Visit  @DATE @            Patient name: Julie Robbins MRN [id number]  Date of birth: 1980/08/21  CC & HPI:  Julie Robbins is a 38 y.o. female presenting today for f/u of fibroids. Would like an u/s to know how fibroids are doing. .   ROS:  ROS +uterine fibroids he is status post open myomectomy x2 with approximately thousand grams of fibroid removed each time and then laparoscopic myomectomy the third time, second and third surgeries by Dr. Kerin Perna in [facility]  Pertinent History Reviewed:   Reviewed: Significant for myomectomy Medical         Past Medical History:  Diagnosis Date  . Medical history non-contributory                               Surgical Hx:    Past Surgical History:  Procedure Laterality Date  . CESAREAN SECTION N/A 02/11/2014   Procedure: CESAREAN SECTION;  Surgeon: [name], MD;  Location: [facility];  Service: Obstetrics;  Laterality: N/A;  . CESAREAN SECTION N/A 12/20/2016   Procedure: REPEAT CESAREAN SECTION;  Surgeon: [name], MD;  Location: [facility];  Service: Obstetrics;  Laterality: N/A;  . MYOMECTOMY  2006,2011,2014   Medications: Reviewed & Updated - see associated section                       Current Outpatient Medications:  .  Cholecalciferol (VITAMIN D3 SUPER STRENGTH) 2000 units TABS, Take 2,000 Units by mouth at bedtime., Disp: , Rfl:  .  ibuprofen (ADVIL,MOTRIN) 600 MG tablet, Take 1 tablet (600 mg total) by mouth every 6 (six) hours as needed., Disp: 30 tablet, Rfl: 0 .  Prenatal Vit-Fe Sulfate-FA (PRENATAL VITAMIN PO), Take 1 tablet by mouth at bedtime. , Disp: , Rfl:    Social History: Reviewed -  reports that she has never smoked. She has never used smokeless tobacco.  Objective Findings:  Vitals: unknown if currently breastfeeding.  PHYSICAL EXAMINATION General appearance -  alert, well appearing, and in no distress Mental status - alert, oriented to person, place, and time, normal mood, behavior, speech, dress, motor activity, and thought processes, affect appropriate to mood  PELVIC Deferred, discussion only   Assessment & Plan:   A:  1.  Uterine fibroids 2.  3. Status post myomectomies x3  P:  1. TV u/s for fibroids 2. 2 weeks f/u with results by phone    By signing my name below, I, Samul Dada, attest that this documentation has been prepared under the direction and in the presence of [name], MD. Electronically Signed: [place]. 12/01/18. 9:09 AM.  I personally performed the services described in this documentation, which was SCRIBED in my presence. The recorded information has been reviewed and considered accurate. It has been edited as necessary during review. [name], MD

## 2018-12-08 ENCOUNTER — Other Ambulatory Visit: Payer: Self-pay | Admitting: Obstetrics and Gynecology

## 2018-12-08 DIAGNOSIS — Z86018 Personal history of other benign neoplasm: Secondary | ICD-10-CM

## 2018-12-10 ENCOUNTER — Encounter: Payer: Self-pay | Admitting: Obstetrics and Gynecology

## 2018-12-10 ENCOUNTER — Other Ambulatory Visit: Payer: Self-pay

## 2018-12-10 ENCOUNTER — Ambulatory Visit (INDEPENDENT_AMBULATORY_CARE_PROVIDER_SITE_OTHER): Payer: BLUE CROSS/BLUE SHIELD | Admitting: Obstetrics and Gynecology

## 2018-12-10 ENCOUNTER — Ambulatory Visit (INDEPENDENT_AMBULATORY_CARE_PROVIDER_SITE_OTHER): Payer: BLUE CROSS/BLUE SHIELD

## 2018-12-10 VITALS — BP 134/87 | HR 70 | Ht 64.0 in | Wt 163.4 lb

## 2018-12-10 DIAGNOSIS — D251 Intramural leiomyoma of uterus: Secondary | ICD-10-CM

## 2018-12-10 DIAGNOSIS — D250 Submucous leiomyoma of uterus: Secondary | ICD-10-CM

## 2018-12-10 DIAGNOSIS — D252 Subserosal leiomyoma of uterus: Secondary | ICD-10-CM | POA: Diagnosis not present

## 2018-12-10 DIAGNOSIS — Z86018 Personal history of other benign neoplasm: Secondary | ICD-10-CM

## 2018-12-10 NOTE — Progress Notes (Signed)
Patient ID: Julie Robbins, female   DOB: 1980/06/18, 38 y.o.   MRN: [id number]    [facility] Visit  @DATE @            Patient name: Julie Robbins MRN [id number]  Date of birth: Dec 23, 1980  CC & HPI:  Julie Robbins is a 38 y.o. female presenting today for tv u/s f/u: PELVIC US TA/TV: enlarged heterogeneous anteverted uterus with mult fibroids,largest fibroids (#1) fundal intramural w/calcification 5.2 x 2.8 x 3.9 cm,(#2) LUS intramural fibroid 4.3 x 3.5 x 4.6 cm,echogenic mass within the endocervical canal ? Polyp 1 x .7 cm w/color flow,EEC 14.3 mm,normal right ovary with a dominate simple follicle 2.3 x 1.4 x 1.7 cm,simple left ovarian cyst 2.6 x 1.3 x 2.1 cm  ROS:  ROS   Pertinent History Reviewed:   Reviewed:  Medical         Past Medical History:  Diagnosis Date   Medical history non-contributory                               Surgical Hx:    Past Surgical History:  Procedure Laterality Date   CESAREAN SECTION N/A 02/11/2014   Procedure: CESAREAN SECTION;  Surgeon: [name], MD;  Location: [facility];  Service: Obstetrics;  Laterality: N/A;   CESAREAN SECTION N/A 12/20/2016   Procedure: REPEAT CESAREAN SECTION;  Surgeon: [name], MD;  Location: [facility];  Service: Obstetrics;  Laterality: N/A;   MYOMECTOMY  2006,2011,2014   Medications: Reviewed & Updated - see associated section                       Current Outpatient Medications:    Cholecalciferol (VITAMIN D3 SUPER STRENGTH) 2000 units TABS, Take 2,000 Units by mouth at bedtime., Disp: , Rfl:    ibuprofen (ADVIL,MOTRIN) 600 MG tablet, Take 1 tablet (600 mg total) by mouth every 6 (six) hours as needed., Disp: 30 tablet, Rfl: 0   Prenatal Vit-Fe Sulfate-FA (PRENATAL VITAMIN PO), Take 1 tablet by mouth at bedtime. , Disp: , Rfl:    Social History: Reviewed -  reports that she has never smoked. She has never used smokeless tobacco.  Objective Findings:  Vitals: Blood  pressure 134/87, pulse 70, height 5\' 4"  (1.626 m), weight 163 lb 6.4 oz (74.1 kg), last menstrual period 11/29/2018, not currently breastfeeding.  PHYSICAL EXAMINATION General appearance - alert, well appearing, and in no distress Mental status - alert, oriented to person, place, and time, normal mood, behavior, speech, dress, motor activity, and thought processes  PELVIC Discussion only  Assessment & Plan:   A:  1. Endocervical polyp 2. Uterine fibroids 3. Uterus size 12-14 week  P:  1. Options of hysterectomy discussed with patient.  The likely continued progression uterine size is explained to the patient.  She is not ready to make a decision and currently her menstrual flow is reasonable.  Will follow for now 2. F/u in 2021    By signing my name below, I, Samul Dada, attest that this documentation has been prepared under the direction and in the presence of [name], MD. Electronically Signed: [place]. 12/10/18. 2:45 PM.  I personally performed the services described in this documentation, which was SCRIBED in my presence. The recorded information has been reviewed and considered accurate. It has been edited as necessary during review. Angelyn Punt  Glo Herring, MD

## 2018-12-10 NOTE — Progress Notes (Signed)
PELVIC US TA/TV: enlarged heterogeneous anteverted uterus with mult fibroids,largest fibroids (#1) fundal intramural w/calcification 5.2 x 2.8 x 3.9 cm,(#2) LUS intramural fibroid 4.3 x 3.5 x 4.6 cm,echogenic mass within the endocervical canal ? Polyp 1 x .7 cm w/color flow,EEC 14.3 mm,normal right ovary with a dominate simple follicle 2.3 x 1.4 x 1.7 cm,simple left ovarian cyst 2.6 x 1.3 x 2.1 cm

## 2018-12-11 DIAGNOSIS — N3000 Acute cystitis without hematuria: Secondary | ICD-10-CM | POA: Diagnosis not present

## 2018-12-11 DIAGNOSIS — R309 Painful micturition, unspecified: Secondary | ICD-10-CM | POA: Diagnosis not present

## 2018-12-29 DIAGNOSIS — F4322 Adjustment disorder with anxiety: Secondary | ICD-10-CM | POA: Diagnosis not present

## 2019-01-12 DIAGNOSIS — F4322 Adjustment disorder with anxiety: Secondary | ICD-10-CM | POA: Diagnosis not present

## 2019-01-19 ENCOUNTER — Encounter: Payer: Self-pay | Admitting: Internal Medicine

## 2019-01-19 ENCOUNTER — Other Ambulatory Visit: Payer: Self-pay

## 2019-01-19 ENCOUNTER — Ambulatory Visit: Payer: BLUE CROSS/BLUE SHIELD | Admitting: Internal Medicine

## 2019-01-19 VITALS — BP 122/66 | HR 70 | Temp 98.4°F | Ht 64.0 in | Wt 161.2 lb

## 2019-01-19 DIAGNOSIS — N390 Urinary tract infection, site not specified: Secondary | ICD-10-CM | POA: Diagnosis not present

## 2019-01-19 DIAGNOSIS — Z0000 Encounter for general adult medical examination without abnormal findings: Secondary | ICD-10-CM

## 2019-01-19 DIAGNOSIS — Z23 Encounter for immunization: Secondary | ICD-10-CM

## 2019-01-19 LAB — POCT URINALYSIS DIPSTICK
Bilirubin, UA: NEGATIVE
Glucose, UA: NEGATIVE
Ketones, UA: NEGATIVE
Leukocytes, UA: NEGATIVE
Nitrite, UA: NEGATIVE
Protein, UA: NEGATIVE
Spec Grav, UA: 1.01 (ref 1.010–1.025)
Urobilinogen, UA: 0.2 E.U./dL
pH, UA: 6.5 (ref 5.0–8.0)

## 2019-01-19 NOTE — Progress Notes (Signed)
Subjective:     Patient ID: Julie Robbins , female    DOB: 28-Jul-1980 , 38 y.o.   MRN: [id number]   Chief Complaint  Patient presents with  . Annual Exam    HPI  She is here today for a full physical examination. She is followed by Dr. Mallory Shirk for her pap smears. Her last one was in 2018. She is due for her next exam in 2021.     Past Medical History:  Diagnosis Date  . Medical history non-contributory      Family History  Problem Relation Age of Onset  . Other Mother        degenerative joint disease  . Hypertension Mother   . Hypertension Maternal Grandmother   . Other Maternal Grandmother        degenerative joint disease  . Cancer Paternal Grandfather        multiple myloma  . Healthy Father      Current Outpatient Medications:  .  Cholecalciferol (VITAMIN D3 SUPER STRENGTH) 2000 units TABS, Take 2,000 Units by mouth at bedtime., Disp: , Rfl:  .  ibuprofen (ADVIL,MOTRIN) 600 MG tablet, Take 1 tablet (600 mg total) by mouth every 6 (six) hours as needed., Disp: 30 tablet, Rfl: 0 .  Prenatal Vit-Fe Sulfate-FA (PRENATAL VITAMIN PO), Take 1 tablet by mouth at bedtime. , Disp: , Rfl:    Allergies  Allergen Reactions  . Doxycycline Hyclate     REACTION: Lip swelling     Review of Systems  Constitutional: Negative.   HENT: Negative.   Eyes: Negative.   Respiratory: Negative.   Cardiovascular: Negative.   Gastrointestinal: Negative.   Endocrine: Negative.   Genitourinary: Negative.        She c/o frequent UTIs. She thinks her sx are related to sexual activity. She reports she does urinate after having sex with her husband. She has yet to discuss with her GYN. She has been going to urgent care for these episodes. She went to Urgent care last month and was told UTI neg although she had sx. She has since been taking pyridium after sex and she has felt fine.   Musculoskeletal: Negative.   Skin: Negative.   Allergic/Immunologic: Negative.   Neurological:  Negative.   Hematological: Negative.   Psychiatric/Behavioral: Negative.      Today's Vitals   01/19/19 1609  BP: 122/66  Pulse: 70  Temp: 98.4 F (36.9 C)  TempSrc: Oral  SpO2: 98%  Weight: 161 lb 3.2 oz (73.1 kg)  Height: 5' 4" (1.626 m)   Body mass index is 27.67 kg/m.   Objective:  Physical Exam Vitals signs and nursing note reviewed. Chaperone present: she wore jeans/shoes for exam.  Constitutional:      Appearance: Normal appearance.  HENT:     Head: Normocephalic and atraumatic.     Right Ear: Tympanic membrane, ear canal and external ear normal.     Left Ear: Tympanic membrane, ear canal and external ear normal.     Nose: Nose normal.     Mouth/Throat:     Mouth: Mucous membranes are moist.     Pharynx: Oropharynx is clear.  Eyes:     Extraocular Movements: Extraocular movements intact.     Conjunctiva/sclera: Conjunctivae normal.     Pupils: Pupils are equal, round, and reactive to light.  Neck:     Musculoskeletal: Normal range of motion and neck supple.  Cardiovascular:     Rate and Rhythm: Normal rate and  regular rhythm.     Pulses: Normal pulses.     Heart sounds: Normal heart sounds.  Pulmonary:     Effort: Pulmonary effort is normal.     Breath sounds: Normal breath sounds.  Chest:     Breasts: Tanner Score is 5.        Right: Normal.        Left: Normal.  Abdominal:     General: Abdomen is flat. Bowel sounds are normal.     Palpations: Abdomen is soft.  Genitourinary:    Comments: deferred Musculoskeletal: Normal range of motion.  Skin:    General: Skin is warm and dry.  Neurological:     General: No focal deficit present.     Mental Status: She is alert and oriented to person, place, and time.  Psychiatric:        Mood and Affect: Mood normal.        Behavior: Behavior normal.         Assessment And Plan:    1. Encounter for annual physical exam  A full exam was performed.  Importance of monthly self breast exams was discussed  with the patient. PATIENT HAS BEEN ADVISED TO GET 30-45 MINUTES REGULAR EXERCISE NO LESS THAN FOUR TO FIVE DAYS PER WEEK - BOTH WEIGHTBEARING EXERCISES AND AEROBIC ARE RECOMMENDED.  SHE WAS ADVISED TO FOLLOW A HEALTHY DIET WITH AT LEAST SIX FRUITS/VEGGIES PER DAY, DECREASE INTAKE OF RED MEAT, AND TO INCREASE FISH INTAKE TO TWO DAYS PER WEEK.  MEATS/FISH SHOULD NOT BE FRIED, BAKED OR BROILED IS PREFERABLE.  I SUGGEST WEARING SPF 50 SUNSCREEN ON EXPOSED PARTS AND ESPECIALLY WHEN IN THE DIRECT SUNLIGHT FOR AN EXTENDED PERIOD OF TIME.  PLEASE AVOID FAST FOOD RESTAURANTS AND INCREASE YOUR WATER INTAKE.  - POCT Urinalysis Dipstick (81002) - Flu Vaccine QUAD 6+ mos PF IM (Fluarix Quad PF) - CMP14+EGFR - CBC - Lipid panel - Hemoglobin A1c - TSH  2. Recurrent UTI  She is advised to take pyridium or AZO after any sexual activity. She is also encouraged to stay well hydrated.  She declined Urology evaluation and referral to GYN for bladder studies. She will let me know if her sx persists.   3. Need for influenza vaccination  - Flu Vaccine QUAD 6+ mos PF IM (Fluarix Quad PF)    Maximino Greenland, MD    THE PATIENT IS ENCOURAGED TO PRACTICE SOCIAL DISTANCING DUE TO THE COVID-19 PANDEMIC.

## 2019-01-19 NOTE — Patient Instructions (Signed)
Health Maintenance, Female Adopting a healthy lifestyle and getting preventive care are important in promoting health and wellness. Ask your health care provider about:  The right schedule for you to have regular tests and exams.  Things you can do on your own to prevent diseases and keep yourself healthy. What should I know about diet, weight, and exercise? Eat a healthy diet   Eat a diet that includes plenty of vegetables, fruits, low-fat dairy products, and lean protein.  Do not eat a lot of foods that are high in solid fats, added sugars, or sodium. Maintain a healthy weight Body mass index (BMI) is used to identify weight problems. It estimates body fat based on height and weight. Your health care provider can help determine your BMI and help you achieve or maintain a healthy weight. Get regular exercise Get regular exercise. This is one of the most important things you can do for your health. Most adults should:  Exercise for at least 150 minutes each week. The exercise should increase your heart rate and make you sweat (moderate-intensity exercise).  Do strengthening exercises at least twice a week. This is in addition to the moderate-intensity exercise.  Spend less time sitting. Even light physical activity can be beneficial. Watch cholesterol and blood lipids Have your blood tested for lipids and cholesterol at 37 years of age, then have this test every 5 years. Have your cholesterol levels checked more often if:  Your lipid or cholesterol levels are high.  You are older than 37 years of age.  You are at high risk for heart disease. What should I know about cancer screening? Depending on your health history and family history, you may need to have cancer screening at various ages. This may include screening for:  Breast cancer.  Cervical cancer.  Colorectal cancer.  Skin cancer.  Lung cancer. What should I know about heart disease, diabetes, and high blood  pressure? Blood pressure and heart disease  High blood pressure causes heart disease and increases the risk of stroke. This is more likely to develop in people who have high blood pressure readings, are of African descent, or are overweight.  Have your blood pressure checked: ? Every 3-5 years if you are 18-39 years of age. ? Every year if you are 40 years old or older. Diabetes Have regular diabetes screenings. This checks your fasting blood sugar level. Have the screening done:  Once every three years after age 40 if you are at a normal weight and have a low risk for diabetes.  More often and at a younger age if you are overweight or have a high risk for diabetes. What should I know about preventing infection? Hepatitis B If you have a higher risk for hepatitis B, you should be screened for this virus. Talk with your health care provider to find out if you are at risk for hepatitis B infection. Hepatitis C Testing is recommended for:  Everyone born from 1945 through 1965.  Anyone with known risk factors for hepatitis C. Sexually transmitted infections (STIs)  Get screened for STIs, including gonorrhea and chlamydia, if: ? You are sexually active and are younger than 37 years of age. ? You are older than 37 years of age and your health care provider tells you that you are at risk for this type of infection. ? Your sexual activity has changed since you were last screened, and you are at increased risk for chlamydia or gonorrhea. Ask your health care provider if   you are at risk.  Ask your health care provider about whether you are at high risk for HIV. Your health care provider may recommend a prescription medicine to help prevent HIV infection. If you choose to take medicine to prevent HIV, you should first get tested for HIV. You should then be tested every 3 months for as long as you are taking the medicine. Pregnancy  If you are about to stop having your period (premenopausal) and  you may become pregnant, seek counseling before you get pregnant.  Take 400 to 800 micrograms (mcg) of folic acid every day if you become pregnant.  Ask for birth control (contraception) if you want to prevent pregnancy. Osteoporosis and menopause Osteoporosis is a disease in which the bones lose minerals and strength with aging. This can result in bone fractures. If you are 65 years old or older, or if you are at risk for osteoporosis and fractures, ask your health care provider if you should:  Be screened for bone loss.  Take a calcium or vitamin D supplement to lower your risk of fractures.  Be given hormone replacement therapy (HRT) to treat symptoms of menopause. Follow these instructions at home: Lifestyle  Do not use any products that contain nicotine or tobacco, such as cigarettes, e-cigarettes, and chewing tobacco. If you need help quitting, ask your health care provider.  Do not use street drugs.  Do not share needles.  Ask your health care provider for help if you need support or information about quitting drugs. Alcohol use  Do not drink alcohol if: ? Your health care provider tells you not to drink. ? You are pregnant, may be pregnant, or are planning to become pregnant.  If you drink alcohol: ? Limit how much you use to 0-1 drink a day. ? Limit intake if you are breastfeeding.  Be aware of how much alcohol is in your drink. In the [place] one drink equals one 12 oz bottle of beer (355 mL), one 5 oz glass of wine (148 mL), or one 1 oz glass of hard liquor (44 mL). General instructions  Schedule regular health, dental, and eye exams.  Stay current with your vaccines.  Tell your health care provider if: ? You often feel depressed. ? You have ever been abused or do not feel safe at home. Summary  Adopting a healthy lifestyle and getting preventive care are important in promoting health and wellness.  Follow your health care provider's instructions about healthy  diet, exercising, and getting tested or screened for diseases.  Follow your health care provider's instructions on monitoring your cholesterol and blood pressure. This information is not intended to replace advice given to you by your health care provider. Make sure you discuss any questions you have with your health care provider. Document Released: 10/09/2010 Document Revised: 03/19/2018 Document Reviewed: 03/19/2018 Elsevier Patient Education  2020 Elsevier Inc.  

## 2019-01-20 LAB — CBC
Hematocrit: 35.2 % (ref 34.0–46.6)
Hemoglobin: 11.6 g/dL (ref 11.1–15.9)
MCH: 30.3 pg (ref 26.6–33.0)
MCHC: 33 g/dL (ref 31.5–35.7)
MCV: 92 fL (ref 79–97)
Platelets: 238 10*3/uL (ref 150–450)
RBC: 3.83 x10E6/uL (ref 3.77–5.28)
RDW: 13.1 % (ref 11.7–15.4)
WBC: 4.6 10*3/uL (ref 3.4–10.8)

## 2019-01-20 LAB — CMP14+EGFR
ALT: 7 IU/L (ref 0–32)
AST: 20 IU/L (ref 0–40)
Albumin/Globulin Ratio: 1.7 (ref 1.2–2.2)
Albumin: 4.6 g/dL (ref 3.8–4.8)
Alkaline Phosphatase: 51 IU/L (ref 39–117)
BUN/Creatinine Ratio: 15 (ref 9–23)
BUN: 9 mg/dL (ref 6–20)
Bilirubin Total: 0.4 mg/dL (ref 0.0–1.2)
CO2: 21 mmol/L (ref 20–29)
Calcium: 9.1 mg/dL (ref 8.7–10.2)
Chloride: 103 mmol/L (ref 96–106)
Creatinine, Ser: 0.61 mg/dL (ref 0.57–1.00)
GFR calc Af Amer: 134 mL/min/{1.73_m2} (ref >59)
GFR calc non Af Amer: 116 mL/min/{1.73_m2} (ref >59)
Globulin, Total: 2.7 g/dL (ref 1.5–4.5)
Glucose: 86 mg/dL (ref 65–99)
Potassium: 4.1 mmol/L (ref 3.5–5.2)
Sodium: 138 mmol/L (ref 134–144)
Total Protein: 7.3 g/dL (ref 6.0–8.5)

## 2019-01-20 LAB — HEMOGLOBIN A1C
Est. average glucose Bld gHb Est-mCnc: 111 mg/dL
Hgb A1c MFr Bld: 5.5 % (ref 4.8–5.6)

## 2019-01-20 LAB — LIPID PANEL
Chol/HDL Ratio: 3.1 ratio (ref 0.0–4.4)
Cholesterol, Total: 129 mg/dL (ref 100–199)
HDL: 42 mg/dL (ref >39)
LDL Chol Calc (NIH): 75 mg/dL (ref 0–99)
Triglycerides: 52 mg/dL (ref 0–149)
VLDL Cholesterol Cal: 12 mg/dL (ref 5–40)

## 2019-01-20 LAB — TSH: TSH: 1.2 u[IU]/mL (ref 0.450–4.500)

## 2019-02-02 DIAGNOSIS — F4322 Adjustment disorder with anxiety: Secondary | ICD-10-CM | POA: Diagnosis not present

## 2019-03-09 DIAGNOSIS — F4322 Adjustment disorder with anxiety: Secondary | ICD-10-CM | POA: Diagnosis not present

## 2019-03-23 DIAGNOSIS — F4322 Adjustment disorder with anxiety: Secondary | ICD-10-CM | POA: Diagnosis not present

## 2019-05-14 ENCOUNTER — Encounter: Payer: Self-pay | Admitting: Internal Medicine

## 2019-10-21 ENCOUNTER — Other Ambulatory Visit: Payer: BLUE CROSS/BLUE SHIELD | Admitting: Obstetrics and Gynecology

## 2019-10-28 ENCOUNTER — Other Ambulatory Visit: Payer: Self-pay | Admitting: Obstetrics and Gynecology

## 2019-11-17 NOTE — Progress Notes (Signed)
PATIENT ID: Julie Robbins, female     DOB: [birth year], 39 y.o.     MRN: [id number]   [facility] Visit  11/17/19     PATIENT NAME: Julie Robbins     MRN [id number]     DOB: 1980-11-01  CC & HPI:  Julie Robbins is a 38 y.o. female presenting today for an annual exam.  She is status post myomectomy x2.  She removed 600 g 1000 g of fibroid the second time she then went on to 2 uncomplicated pregnancies delivered by cesarean section.  The fibroids are started to grow back and are now easily palpable above the symphysis pubis and pressing on her bladder.  She is aware with her rapid growth rate of fibroids that she will likely come to hysterectomy at some point.  We discussed this at length, the pros and cons of myomectomy versus hysterectomy at this point she accepts that supracervical hysterectomy would be of simpler procedure.  The pros and cons of ovarian preservation been discussed.  The pros and cons of removal of the fallopian tubes have been discussed we discussed leaving the cervix to improve vaginal lubrication and pelvic support.  She is not interested in proceeding toward surgery at this point but may consider in the next 1 to 2 years ROS:  Review of Systems  Constitutional: Negative.   HENT: Negative.   Eyes: Negative.   Respiratory: Negative.   Cardiovascular: Negative.   Gastrointestinal: Negative.   Genitourinary: Negative.   Musculoskeletal: Negative.   Skin: Negative.   Neurological: Negative.   Endo/Heme/Allergies: Negative.   Psychiatric/Behavioral: Negative.   All other systems reviewed and are negative.    Gynecologic History:  No LMP recorded. Contraception: condoms Last Pap: 06/27/2016. Results were: normal Last mammogram: N/A, under 40.   Pertinent History Reviewed:  Reviewed: Significant for good overall health Medical         Past Medical History:  Diagnosis Date  . Medical history non-contributory                                Surgical Hx:    Past Surgical History:  Procedure Laterality Date  . CESAREAN SECTION N/A 02/11/2014   Procedure: CESAREAN SECTION;  Surgeon: [name], MD;  Location: [facility];  Service: Obstetrics;  Laterality: N/A;  . CESAREAN SECTION N/A 12/20/2016   Procedure: REPEAT CESAREAN SECTION;  Surgeon: [name], MD;  Location: [facility];  Service: Obstetrics;  Laterality: N/A;  . MYOMECTOMY  2006,2011,2014   Medications: Reviewed & Updated - see associated section                       Current Outpatient Medications:  .  Cholecalciferol (VITAMIN D3 SUPER STRENGTH) 2000 units TABS, Take 2,000 Units by mouth at bedtime., Disp: , Rfl:  .  ibuprofen (ADVIL,MOTRIN) 600 MG tablet, Take 1 tablet (600 mg total) by mouth every 6 (six) hours as needed., Disp: 30 tablet, Rfl: 0 .  Prenatal Vit-Fe Sulfate-FA (PRENATAL VITAMIN PO), Take 1 tablet by mouth at bedtime. , Disp: , Rfl:    Social History: Reviewed -  reports that she has never smoked. She has never used smokeless tobacco.  OB History  Gravida Para Term Preterm AB Living  2 2 2     2   SAB TAB Ectopic Multiple Live Births  0 2    # Outcome Date GA Lbr Len/2nd Weight Sex Delivery Anes PTL Lv  2 Term 12/20/16 [redacted]w[redacted]d  7 lb 2.8 oz (3.255 kg) M CS-LTranv Spinal  LIV  1 Term 02/11/14 [redacted]w[redacted]d  6 lb 7 oz (2.92 kg) F CS-LVertical Spinal N LIV     Objective Findings:  Vitals: There were no vitals taken for this visit.  PHYSICAL EXAMINATION General appearance - alert, well appearing, and in no distress, oriented to person, place, and time and normal appearing weight Mental status - alert, oriented to person, place, and time, normal mood, behavior, speech, dress, motor activity, and thought processes Chest - , symmetric air entry, not examined, unlabored breathing Heart - normal rate and regular rhythm Abdomen - soft, nontender, nondistended, no masses or organomegaly Uterus palpable easily 4 cm above the symphysis pubis  and mildly uncomfortable on bimanual Breasts - patient declines to have breast exam Skin - normal coloration and turgor, no rashes, no suspicious skin lesions noted  PELVIC External genitalia -normal female Vulva -normal Vagina -normal Cervix -present  Uterus -40+ weeks size, irregular firm hard pressing on the bladder Adnexa -nontender cervix mobile [place] -not applied Rectal - rectal exam not indicated  Assessment & Plan:   A:  1. Uterine fibroids 14+ weeks size 2. History of cesarean section x2, multiple myomectomy  P:  1. Patient is stable now follow-up when she is ready for me which would be likely done midline vertical incision, supracervical hysterectomy with bilateral salpingectomy is likely the plan   By signing my name below, I, [employer], attest that this documentation has been prepared under the direction and in the presence of [name], MD. Electronically Signed: [place]. 11/17/19. 11:05 PM.  I personally performed the services described in this documentation, which was SCRIBED in my presence. The recorded information has been reviewed and considered accurate. It has been edited as necessary during review. [name], MD

## 2019-11-18 ENCOUNTER — Encounter: Payer: Self-pay | Admitting: Obstetrics and Gynecology

## 2019-11-18 ENCOUNTER — Ambulatory Visit (INDEPENDENT_AMBULATORY_CARE_PROVIDER_SITE_OTHER): Payer: BLUE CROSS/BLUE SHIELD | Admitting: Obstetrics and Gynecology

## 2019-11-18 ENCOUNTER — Other Ambulatory Visit (HOSPITAL_COMMUNITY)
Admission: RE | Admit: 2019-11-18 | Discharge: 2019-11-18 | Disposition: A | Discharge status: Home / Self Care | Payer: BLUE CROSS/BLUE SHIELD | Source: Ambulatory Visit | Attending: Obstetrics and Gynecology | Admitting: Obstetrics and Gynecology

## 2019-11-18 VITALS — BP 130/83 | HR 72 | Ht 64.0 in | Wt 159.6 lb

## 2019-11-18 DIAGNOSIS — Z01419 Encounter for gynecological examination (general) (routine) without abnormal findings: Secondary | ICD-10-CM

## 2019-11-19 LAB — CYTOLOGY - PAP
Adequacy: ABSENT
Comment: NEGATIVE
Diagnosis: NEGATIVE
High risk HPV: NEGATIVE

## 2020-01-25 ENCOUNTER — Encounter: Payer: BLUE CROSS/BLUE SHIELD | Admitting: Internal Medicine

## 2020-02-03 ENCOUNTER — Encounter: Payer: BLUE CROSS/BLUE SHIELD | Admitting: Internal Medicine

## 2021-03-28 ENCOUNTER — Ambulatory Visit (INDEPENDENT_AMBULATORY_CARE_PROVIDER_SITE_OTHER): Payer: BC Managed Care – PPO | Admitting: Obstetrics & Gynecology

## 2021-03-28 ENCOUNTER — Other Ambulatory Visit: Payer: Self-pay

## 2021-03-28 ENCOUNTER — Encounter: Payer: Self-pay | Admitting: Obstetrics & Gynecology

## 2021-03-28 VITALS — BP 131/89 | HR 83 | Ht 64.0 in | Wt 172.0 lb

## 2021-03-28 DIAGNOSIS — Z9889 Other specified postprocedural states: Secondary | ICD-10-CM

## 2021-03-28 DIAGNOSIS — Z98891 History of uterine scar from previous surgery: Secondary | ICD-10-CM | POA: Diagnosis not present

## 2021-03-28 DIAGNOSIS — D219 Benign neoplasm of connective and other soft tissue, unspecified: Secondary | ICD-10-CM

## 2021-03-28 NOTE — Progress Notes (Signed)
Chief Complaint  Patient presents with   Fibroids      40 y.o. [id number] Patient's last menstrual period was 03/13/2021 (exact date). The current method of family planning is none.  Outpatient Encounter Medications as of 03/28/2021  Medication Sig   Cholecalciferol 50 MCG (2000 UT) TABS Take 2,000 Units by mouth at bedtime.   ibuprofen (ADVIL,MOTRIN) 600 MG tablet Take 1 tablet (600 mg total) by mouth every 6 (six) hours as needed.   Prenatal Vit-Fe Sulfate-FA (PRENATAL VITAMIN PO) Take 1 tablet by mouth at bedtime.    [DISCONTINUED] Probiotic Product (PROBIOTIC ADVANCED PO) Take by mouth. (Patient not taking: Reported on 03/28/2021)   No facility-administered encounter medications on file as of 03/28/2021.    Subjective Pt with long history of issues with fibroids, s/p myomectomies x 2 Past Medical History:  Diagnosis Date   Medical history non-contributory     Past Surgical History:  Procedure Laterality Date   CESAREAN SECTION N/A 02/11/2014   Procedure: CESAREAN SECTION;  Surgeon: [name], MD;  Location: [facility];  Service: Obstetrics;  Laterality: N/A;   CESAREAN SECTION N/A 12/20/2016   Procedure: REPEAT CESAREAN SECTION;  Surgeon: [name], MD;  Location: [facility];  Service: Obstetrics;  Laterality: N/A;   MYOMECTOMY  2006,2011,2014    OB History     Gravida  2   Para  2   Term  2   Preterm      AB      Living  2      SAB      IAB      Ectopic      Multiple  0   Live Births  2           Allergies  Allergen Reactions   Doxycycline Hyclate     REACTION: Lip swelling    Social History   Socioeconomic History   Marital status: Married    Spouse name: Not on file   Number of children: Not on file   Years of education: Not on file   Highest education level: Not on file  Occupational History   Not on file  Tobacco Use   Smoking status: Never   Smokeless tobacco: Never  Vaping Use   Vaping Use: Never  used  Substance and Sexual Activity   Alcohol use: Yes    Comment: rarely   Drug use: No   Sexual activity: Yes    Birth control/protection: None  Other Topics Concern   Not on file  Social History Narrative   Not on file   Social Determinants of Health   Financial Resource Strain: Low Risk  (11/18/2019)   Overall Financial Resource Strain (CARDIA)    Difficulty of Paying Living Expenses: Not hard at all  Food Insecurity: No Food Insecurity (11/18/2019)   Hunger Vital Sign    Worried About Running Out of Food in the Last Year: Never true    Ran Out of Food in the Last Year: Never true  Transportation Needs: No Transportation Needs (11/18/2019)   PRAPARE - Hydrologist (Medical): No    Lack of Transportation (Non-Medical): No  Physical Activity: Inactive (11/18/2019)   Exercise Vital Sign    Days of Exercise per Week: 0 days    Minutes of Exercise per Session: 0 min  Stress: No Stress Concern Present (11/18/2019)   [place]  Feeling of Stress : Not at all  Social Connections: Moderately Integrated (11/18/2019)   Social Connection and Isolation Panel [NHANES]    Frequency of Communication with Friends and Family: Three times a week    Frequency of Social Gatherings with Friends and Family: Once a week    Attends Religious Services: More than 4 times per year    Active Member of [employer] or Organizations: No    Attends Music therapist: Never    Marital Status: Married    Family History  Problem Relation Age of Onset   Other Mother        degenerative joint disease   Hypertension Mother    Hypertension Maternal Grandmother    Other Maternal Grandmother        degenerative joint disease   Cancer Paternal Grandfather        multiple myloma   Colon polyps Father     Medications:       Current Outpatient Medications:    Cholecalciferol 50 MCG (2000 UT) TABS, Take  2,000 Units by mouth at bedtime., Disp: , Rfl:    ibuprofen (ADVIL,MOTRIN) 600 MG tablet, Take 1 tablet (600 mg total) by mouth every 6 (six) hours as needed., Disp: 30 tablet, Rfl: 0   Prenatal Vit-Fe Sulfate-FA (PRENATAL VITAMIN PO), Take 1 tablet by mouth at bedtime. , Disp: , Rfl:    ferrous sulfate 325 (65 FE) MG tablet, Take 325 mg by mouth once., Disp: , Rfl:   Objective Blood pressure 131/89, pulse 83, height 5\' 4"  (1.626 m), weight 172 lb (78 kg), last menstrual period 03/13/2021.  General WDWN female NAD Vulva:  normal appearing vulva with no masses, tenderness or lesions Vagina:  normal mucosa, no discharge Cervix:  Normal no lesions Uterus:  16 weeks size filling the pelvis side wall to side wall Adnexa: ovaries:present,  normal adnexa in size, nontender and no masses   Pertinent ROS No burning with urination, frequency or urgency No nausea, vomiting or diarrhea Nor fever chills or other constitutional symptoms   Labs or studies     Impression Diagnoses this Encounter::   ICD-10-CM   1. Fibroids, 16 weeks size, fills pelvis side to side  D21.9     2. Previous cesarean section x 2  Z98.891     3. History of myomectomy x 2  Z98.890       Established relevant diagnosis(es):   Plan/Recommendations: No orders of the defined types were placed in this encounter.   Labs or Scans Ordered: No orders of the defined types were placed in this encounter.   Management:: I recommend abdominal hysterectomy, not IR  or myomectomy  Follow up Return if symptoms worsen or fail to improve.       All questions were answered.

## 2021-06-29 ENCOUNTER — Other Ambulatory Visit: Payer: Self-pay

## 2021-06-29 ENCOUNTER — Encounter: Payer: Self-pay | Admitting: Internal Medicine

## 2021-06-29 ENCOUNTER — Ambulatory Visit (INDEPENDENT_AMBULATORY_CARE_PROVIDER_SITE_OTHER): Payer: 59 | Admitting: Internal Medicine

## 2021-06-29 VITALS — BP 114/78 | HR 78 | Temp 98.6°F | Ht 63.2 in | Wt 170.8 lb

## 2021-06-29 DIAGNOSIS — E6609 Other obesity due to excess calories: Secondary | ICD-10-CM | POA: Diagnosis not present

## 2021-06-29 DIAGNOSIS — Z6830 Body mass index (BMI) 30.0-30.9, adult: Secondary | ICD-10-CM | POA: Diagnosis not present

## 2021-06-29 DIAGNOSIS — D259 Leiomyoma of uterus, unspecified: Secondary | ICD-10-CM

## 2021-06-29 DIAGNOSIS — Z83719 Family history of colon polyps, unspecified: Secondary | ICD-10-CM

## 2021-06-29 DIAGNOSIS — Z8371 Family history of colonic polyps: Secondary | ICD-10-CM

## 2021-06-29 DIAGNOSIS — Z0000 Encounter for general adult medical examination without abnormal findings: Secondary | ICD-10-CM

## 2021-06-29 NOTE — Patient Instructions (Signed)

## 2021-06-29 NOTE — Progress Notes (Signed)
?Rich Brave [place]leton,acting as a Education administrator for Maximino Greenland, MD.,have documented all relevant documentation on the behalf of Maximino Greenland, MD,as directed by  Maximino Greenland, MD while in the presence of Maximino Greenland, MD.  ?This visit occurred during the SARS-CoV-2 public health emergency.  Safety protocols were in place, including screening questions prior to the visit, additional usage of staff PPE, and extensive cleaning of exam room while observing appropriate contact time as indicated for disinfecting solutions. ? ?Subjective:  ?  ? Patient ID: Julie Robbins , female    DOB: 1980/09/12 , 41 y.o.   MRN: [id number] ? ? ?Chief Complaint  ?Patient presents with  ? Annual Exam  ? ? ?HPI ? ?Patient presents today for a physical. She sees [facility] for her GYN care. Her last pap smear was Sept 2021.  She does not have any questions or concerns today. ?  ? ?Past Medical History:  ?Diagnosis Date  ? Medical history non-contributory   ?  ? ?Family History  ?Problem Relation Age of Onset  ? Other Mother   ?     degenerative joint disease  ? Hypertension Mother   ? Hypertension Maternal Grandmother   ? Other Maternal Grandmother   ?     degenerative joint disease  ? Cancer Paternal Grandfather   ?     multiple myloma  ? Colon polyps Father   ? ? ? ?Current Outpatient Medications:  ?  Cholecalciferol 50 MCG (2000 UT) TABS, Take 2,000 Units by mouth at bedtime., Disp: , Rfl:  ?  ibuprofen (ADVIL,MOTRIN) 600 MG tablet, Take 1 tablet (600 mg total) by mouth every 6 (six) hours as needed., Disp: 30 tablet, Rfl: 0 ?  Prenatal Vit-Fe Sulfate-FA (PRENATAL VITAMIN PO), Take 1 tablet by mouth at bedtime. , Disp: , Rfl:   ? ?Allergies  ?Allergen Reactions  ? Doxycycline Hyclate   ?  REACTION: Lip swelling  ?  ? ? ?The patient states she uses none for birth control. Last LMP was Patient's last menstrual period was 06/25/2021.. Negative for Dysmenorrhea. Negative for: breast discharge, breast lump(s), breast pain  and breast self exam. Associated symptoms include abnormal vaginal bleeding. Pertinent negatives include abnormal bleeding (hematology), anxiety, decreased libido, depression, difficulty falling sleep, dyspareunia, history of infertility, nocturia, sexual dysfunction, sleep disturbances, urinary incontinence, urinary urgency, vaginal discharge and vaginal itching. Diet regular.The patient states her exercise level is  intermittent. ? . The patient's tobacco use is:  ?Social History  ? ?Tobacco Use  ?Smoking Status Never  ?Smokeless Tobacco Never  ?Marland Kitchen She has been exposed to passive smoke. The patient's alcohol use is:  ?Social History  ? ?Substance and Sexual Activity  ?Alcohol Use Yes  ? Comment: rarely  ? ? ?Review of Systems  ?Constitutional: Negative.   ?HENT: Negative.    ?Eyes: Negative.   ?Respiratory: Negative.    ?Cardiovascular: Negative.   ?Gastrointestinal: Negative.   ?Endocrine: Negative.   ?Genitourinary: Negative.   ?Musculoskeletal: Negative.   ?Skin: Negative.   ?Allergic/Immunologic: Negative.   ?Neurological: Negative.   ?Hematological: Negative.   ?Psychiatric/Behavioral: Negative.     ? ?Today's Vitals  ? 06/29/21 1449  ?BP: 114/78  ?Pulse: 78  ?Temp: 98.6 ?F (37 ?C)  ?Weight: 170 lb 12.8 oz (77.5 kg)  ?Height: 5' 3.2" (1.605 m)  ?PainSc: 0-No pain  ? ?Body mass index is 30.06 kg/m?.  ?Wt Readings from Last 3 Encounters:  ?06/29/21 170 lb 12.8 oz (77.5 kg)  ?  03/28/21 172 lb (78 kg)  ?11/18/19 159 lb 9.6 oz (72.4 kg)  ?  ? ?Objective:  ?Physical Exam ?Vitals and nursing note reviewed.  ?Constitutional:   ?   Appearance: Normal appearance.  ?HENT:  ?   Head: Normocephalic and atraumatic.  ?   Right Ear: Tympanic membrane, ear canal and external ear normal.  ?   Left Ear: Tympanic membrane, ear canal and external ear normal.  ?   Nose:  ?   Comments: Masked  ?   Mouth/Throat:  ?   Comments: Masked  ?Eyes:  ?   Extraocular Movements: Extraocular movements intact.  ?   Conjunctiva/sclera:  Conjunctivae normal.  ?   Pupils: Pupils are equal, round, and reactive to light.  ?Cardiovascular:  ?   Rate and Rhythm: Normal rate and regular rhythm.  ?   Pulses: Normal pulses.  ?   Heart sounds: Normal heart sounds.  ?Pulmonary:  ?   Effort: Pulmonary effort is normal.  ?   Breath sounds: Normal breath sounds.  ?Chest:  ?Breasts: ?   Tanner Score is 5.  ?   Right: Normal.  ?   Left: Normal.  ?Abdominal:  ?   General: Bowel sounds are normal.  ?   Palpations: Abdomen is soft.  ?   Comments: Pelvic mass, below umbilicus  ?Genitourinary: ?   Comments: deferred ?Musculoskeletal:     ?   General: Normal range of motion.  ?   Cervical back: Normal range of motion and neck supple.  ?Skin: ?   General: Skin is warm and dry.  ?Neurological:  ?   General: No focal deficit present.  ?   Mental Status: She is alert and oriented to person, place, and time.  ?Psychiatric:     ?   Mood and Affect: Mood normal.     ?   Behavior: Behavior normal.  ?   ?Assessment And Plan:  ?   ?1. Encounter for general adult medical examination w/o abnormal findings ?Comments: A full exam was performed. She is encouraged to perform monthly self breast exams. I will refer her for baseline mammogram. PATIENT IS ADVISED TO GET 30-45 MINUTES REGULAR EXERCISE NO LESS THAN FOUR TO FIVE DAYS PER WEEK - BOTH WEIGHTBEARING EXERCISES AND AEROBIC ARE RECOMMENDED.  PATIENT IS ADVISED TO FOLLOW A HEALTHY DIET WITH AT LEAST SIX FRUITS/VEGGIES PER DAY, DECREASE INTAKE OF RED MEAT, AND TO INCREASE FISH INTAKE TO TWO DAYS PER WEEK.  MEATS/FISH SHOULD NOT BE FRIED, BAKED OR BROILED IS PREFERABLE.  IT IS ALSO IMPORTANT TO CUT BACK ON YOUR SUGAR INTAKE. PLEASE AVOID ANYTHING WITH ADDED SUGAR, CORN SYRUP OR OTHER SWEETENERS. IF YOU MUST USE A SWEETENER, YOU CAN TRY STEVIA. IT IS ALSO IMPORTANT TO AVOID ARTIFICIALLY SWEETENERS AND DIET BEVERAGES. LASTLY, I SUGGEST WEARING SPF 50 SUNSCREEN ON EXPOSED PARTS AND ESPECIALLY WHEN IN THE DIRECT SUNLIGHT FOR AN EXTENDED  PERIOD OF TIME.  PLEASE AVOID FAST FOOD RESTAURANTS AND INCREASE YOUR WATER INTAKE. ? ?- CBC ?- CMP14+EGFR ?- Lipid panel ?- Hemoglobin A1c ?- Hepatitis C antibody ?- TSH ?- Vitamin D (25 hydroxy) ?- Insulin, random(561) ? ?2. Class 1 obesity due to excess calories without serious comorbidity with body mass index (BMI) of 30.0 to 30.9 in adult ?Comments: She is encouraged to aim for at least 150 minutes of exercise per week.  ? ?3. Family history of colonic polyps ?Comments: I will refer her to GI for CRC screening. Her father had adenomatous polyp  in his 71s. He was advised to have his children start [place] screening at age 32. ? ?4. Uterine leiomyoma, unspecified location ?She is followed by GYN, advised hysterectomy was best option for her.  She should avoid soy products and non-grass fed, free range meats. May also benefit from dandelion root tea to help with liver function, which will improve estrogen metabolism.  ? ?Patient was given opportunity to ask questions. Patient verbalized understanding of the plan and was able to repeat key elements of the plan. All questions were answered to their satisfaction.  ? ?I, Maximino Greenland, MD, have reviewed all documentation for this visit. The documentation on 06/29/21 for the exam, diagnosis, procedures, and orders are all accurate and complete.  ? ?THE PATIENT IS ENCOURAGED TO PRACTICE SOCIAL DISTANCING DUE TO THE COVID-19 PANDEMIC.   ?

## 2021-06-30 ENCOUNTER — Encounter: Payer: Self-pay | Admitting: Internal Medicine

## 2021-06-30 LAB — LIPID PANEL
Chol/HDL Ratio: 2.8 ratio (ref 0.0–4.4)
Cholesterol, Total: 132 mg/dL (ref 100–199)
HDL: 48 mg/dL (ref >39)
LDL Chol Calc (NIH): 75 mg/dL (ref 0–99)
Triglycerides: 33 mg/dL (ref 0–149)
VLDL Cholesterol Cal: 9 mg/dL (ref 5–40)

## 2021-06-30 LAB — CMP14+EGFR
ALT: 11 IU/L (ref 0–32)
AST: 19 IU/L (ref 0–40)
Albumin/Globulin Ratio: 1.8 (ref 1.2–2.2)
Albumin: 4.7 g/dL (ref 3.8–4.8)
Alkaline Phosphatase: 50 IU/L (ref 44–121)
BUN/Creatinine Ratio: 25 — ABNORMAL HIGH (ref 9–23)
BUN: 16 mg/dL (ref 6–24)
Bilirubin Total: 0.3 mg/dL (ref 0.0–1.2)
CO2: 22 mmol/L (ref 20–29)
Calcium: 9 mg/dL (ref 8.7–10.2)
Chloride: 104 mmol/L (ref 96–106)
Creatinine, Ser: 0.63 mg/dL (ref 0.57–1.00)
Globulin, Total: 2.6 g/dL (ref 1.5–4.5)
Glucose: 89 mg/dL (ref 70–99)
Potassium: 4.1 mmol/L (ref 3.5–5.2)
Sodium: 138 mmol/L (ref 134–144)
Total Protein: 7.3 g/dL (ref 6.0–8.5)
eGFR: 115 mL/min/{1.73_m2} (ref >59)

## 2021-06-30 LAB — INSULIN, RANDOM: INSULIN: 3.5 u[IU]/mL (ref 2.6–24.9)

## 2021-06-30 LAB — TSH: TSH: 1.3 u[IU]/mL (ref 0.450–4.500)

## 2021-06-30 LAB — HEPATITIS C ANTIBODY: Hep C Virus Ab: NONREACTIVE

## 2021-06-30 LAB — VITAMIN D 25 HYDROXY (VIT D DEFICIENCY, FRACTURES): Vit D, 25-Hydroxy: 38.1 ng/mL (ref 30.0–100.0)

## 2021-06-30 LAB — HEMOGLOBIN A1C
Est. average glucose Bld gHb Est-mCnc: 117 mg/dL
Hgb A1c MFr Bld: 5.7 % — ABNORMAL HIGH (ref 4.8–5.6)

## 2021-06-30 LAB — CBC
Hematocrit: 26.4 % — ABNORMAL LOW (ref 34.0–46.6)
Hemoglobin: 7.9 g/dL — ABNORMAL LOW (ref 11.1–15.9)
MCH: 19.8 pg — ABNORMAL LOW (ref 26.6–33.0)
MCHC: 29.9 g/dL — ABNORMAL LOW (ref 31.5–35.7)
MCV: 66 fL — ABNORMAL LOW (ref 79–97)
Platelets: 316 10*3/uL (ref 150–450)
RBC: 3.99 x10E6/uL (ref 3.77–5.28)
RDW: 16.7 % — ABNORMAL HIGH (ref 11.7–15.4)
WBC: 3.8 10*3/uL (ref 3.4–10.8)

## 2021-07-01 LAB — FERRITIN: Ferritin: 6 ng/mL — ABNORMAL LOW (ref 15–150)

## 2021-07-01 LAB — IRON AND TIBC
Iron Saturation: 4 % — CL (ref 15–55)
Iron: 19 ug/dL — ABNORMAL LOW (ref 27–159)
Total Iron Binding Capacity: 474 ug/dL — ABNORMAL HIGH (ref 250–450)
UIBC: 455 ug/dL — ABNORMAL HIGH (ref 131–425)

## 2021-07-01 LAB — SPECIMEN STATUS REPORT

## 2021-10-24 ENCOUNTER — Encounter: Payer: Self-pay | Admitting: Internal Medicine

## 2021-10-24 ENCOUNTER — Ambulatory Visit (INDEPENDENT_AMBULATORY_CARE_PROVIDER_SITE_OTHER): Payer: 59 | Admitting: Internal Medicine

## 2021-10-24 VITALS — BP 128/60 | HR 78 | Temp 98.2°F | Ht 63.0 in | Wt 169.2 lb

## 2021-10-24 DIAGNOSIS — D259 Leiomyoma of uterus, unspecified: Secondary | ICD-10-CM | POA: Diagnosis not present

## 2021-10-24 DIAGNOSIS — N920 Excessive and frequent menstruation with regular cycle: Secondary | ICD-10-CM | POA: Diagnosis not present

## 2021-10-24 DIAGNOSIS — Z1231 Encounter for screening mammogram for malignant neoplasm of breast: Secondary | ICD-10-CM | POA: Diagnosis not present

## 2021-10-24 DIAGNOSIS — D500 Iron deficiency anemia secondary to blood loss (chronic): Secondary | ICD-10-CM | POA: Diagnosis not present

## 2021-10-24 NOTE — Patient Instructions (Addendum)
[phone] Lysteda  Anemia  Anemia is a condition in which there is not enough red blood cells or hemoglobin in the blood. Hemoglobin is a substance in red blood cells that carries oxygen. When you do not have enough red blood cells or hemoglobin (are anemic), your body cannot get enough oxygen and your organs may not work properly. As a result, you may feel very tired or have other problems. What are the causes? Common causes of anemia include: Excessive bleeding. Anemia can be caused by excessive bleeding inside or outside the body, including bleeding from the intestines or from heavy menstrual periods in females. Poor nutrition. Long-lasting (chronic) kidney, thyroid, and liver disease. Bone marrow disorders, spleen problems, and blood disorders. Cancer and treatments for cancer. HIV (human immunodeficiency virus) and AIDS (acquired immunodeficiency syndrome). Infections, medicines, and autoimmune disorders that destroy red blood cells. What are the signs or symptoms? Symptoms of this condition include: Minor weakness. Dizziness. Headache, or difficulties concentrating and sleeping. Heartbeats that feel irregular or faster than normal (palpitations). Shortness of breath, especially with exercise. Pale skin, lips, and nails, or cold hands and feet. Indigestion and nausea. Symptoms may occur suddenly or develop slowly. If your anemia is mild, you may not have symptoms. How is this diagnosed? This condition is diagnosed based on blood tests, your medical history, and a physical exam. In some cases, a test may be needed in which cells are removed from the soft tissue inside of a bone and looked at under a microscope (bone marrow biopsy). Your health care provider may also check your stool (feces) for blood and may do additional testing to look for the cause of your bleeding. Other tests may include: Imaging tests, such as a CT scan or MRI. A procedure to see inside your esophagus  and stomach (endoscopy). A procedure to see inside your colon and rectum (colonoscopy). How is this treated? Treatment for this condition depends on the cause. If you continue to lose a lot of blood, you may need to be treated at a hospital. Treatment may include: Taking supplements of iron, vitamin K81, or folic acid. Taking a hormone medicine (erythropoietin) that can help to stimulate red blood cell growth. Having a blood transfusion. This may be needed if you lose a lot of blood. Making changes to your diet. Having surgery to remove your spleen. Follow these instructions at home: Take over-the-counter and prescription medicines only as told by your health care provider. Take supplements only as told by your health care provider. Follow any diet instructions that you were given by your health care provider. Keep all follow-up visits as told by your health care provider. This is important. Contact a health care provider if: You develop new bleeding anywhere in the body. Get help right away if: You are very weak. You are short of breath. You have pain in your abdomen or chest. You are dizzy or feel faint. You have trouble concentrating. You have bloody stools, black stools, or tarry stools. You vomit repeatedly or you vomit up blood. These symptoms may represent a serious problem that is an emergency. Do not wait to see if the symptoms will go away. Get medical help right away. Call your local emergency services (911 in the [place] Do not drive yourself to the hospital. Summary Anemia is a condition in which you do not have enough red blood cells or enough of a substance in your red blood cells that carries oxygen (hemoglobin). Symptoms may occur suddenly or develop  slowly. If your anemia is mild, you may not have symptoms. This condition is diagnosed with blood tests, a medical history, and a physical exam. Other tests may be needed. Treatment for this condition depends on the cause of  the anemia. This information is not intended to replace advice given to you by your health care provider. Make sure you discuss any questions you have with your health care provider. Document Revised: 02/07/2021 Document Reviewed: 03/03/2019 Elsevier Patient Education  [employer].

## 2021-10-24 NOTE — Progress Notes (Signed)
Barnet Glasgow Martin,acting as a Education administrator for Maximino Greenland, MD.,have documented all relevant documentation on the behalf of Maximino Greenland, MD,as directed by  Maximino Greenland, MD while in the presence of Maximino Greenland, MD.    Subjective:     Patient ID: Julie Robbins , female    DOB: [birth year] , 41 y.o.   MRN: [id number]   Chief Complaint  Patient presents with   Anemia    HPI  Patient is here for an anemia follow up.  She states she is less fatigued and less SOB. She admits to heavy cycles, she is followed by GYN. She has been advised to have a hysterectomy due to failed past myomectomies. She has been taking OTC iron, she did not tolerate Accrufer samples, they caused nausea.          Past Medical History:  Diagnosis Date   Medical history non-contributory      Family History  Problem Relation Age of Onset   Other Mother        degenerative joint disease   Hypertension Mother    Hypertension Maternal Grandmother    Other Maternal Grandmother        degenerative joint disease   Cancer Paternal Grandfather        multiple myloma   Colon polyps Father      Current Outpatient Medications:    Cholecalciferol 50 MCG (2000 UT) TABS, Take 2,000 Units by mouth at bedtime., Disp: , Rfl:    ferrous sulfate 325 (65 FE) MG tablet, Take 325 mg by mouth once., Disp: , Rfl:    ibuprofen (ADVIL,MOTRIN) 600 MG tablet, Take 1 tablet (600 mg total) by mouth every 6 (six) hours as needed., Disp: 30 tablet, Rfl: 0   Prenatal Vit-Fe Sulfate-FA (PRENATAL VITAMIN PO), Take 1 tablet by mouth at bedtime. , Disp: , Rfl:    Allergies  Allergen Reactions   Doxycycline Hyclate     REACTION: Lip swelling     Review of Systems  Constitutional: Negative.   HENT: Negative.    Eyes: Negative.   Cardiovascular: Negative.   Gastrointestinal: Negative.   Endocrine: Negative.   Genitourinary: Negative.   Skin: Negative.   Psychiatric/Behavioral: Negative.       Today's Vitals    10/24/21 1537  BP: 128/60  Pulse: 78  Temp: 98.2 F (36.8 C)  TempSrc: Oral  Weight: 169 lb 3.2 oz (76.7 kg)  Height: '5\' 3"'$  (1.6 m)  PainSc: 0-No pain   Body mass index is 29.97 kg/m.  Wt Readings from Last 3 Encounters:  10/24/21 169 lb 3.2 oz (76.7 kg)  06/29/21 170 lb 12.8 oz (77.5 kg)  03/28/21 172 lb (78 kg)     Objective:  Physical Exam Vitals and nursing note reviewed.  Constitutional:      Appearance: Normal appearance.  HENT:     Head: Normocephalic and atraumatic.  Eyes:     Extraocular Movements: Extraocular movements intact.  Cardiovascular:     Rate and Rhythm: Normal rate and regular rhythm.     Heart sounds: Normal heart sounds.  Pulmonary:     Effort: Pulmonary effort is normal.     Breath sounds: Normal breath sounds.  Musculoskeletal:     Cervical back: Normal range of motion.  Skin:    General: Skin is warm.  Neurological:     General: No focal deficit present.     Mental Status: She is alert.  Psychiatric:  Mood and Affect: Mood normal.        Behavior: Behavior normal.      Assessment And Plan:     1. Iron deficiency anemia due to chronic blood loss Comments: Intolerant of Accrufer. I will check CBC, iron levels today.  She was also given samples of Fusion Plus to take daily.  - Iron, TIBC and Ferritin Panel - CBC no Diff - Vitamin B12  2. Menorrhagia with regular cycle Comments: This is the underlying cause of her anemia. Her sx are due to fibroids. She has never been on Lysteda.   3. Uterine leiomyoma, unspecified location Comments: She is considering hysterectomy. However, she is encouraged to discuss use of Lysteda with her GYN.   4. Encounter for screening mammogram for malignant neoplasm of breast Comments: I will refer her for baseline mammogram.  - MM DIGITAL SCREENING BILATERAL; Future   Patient was given opportunity to ask questions. Patient verbalized understanding of the plan and was able to repeat key elements of  the plan. All questions were answered to their satisfaction.   I, Maximino Greenland, MD, have reviewed all documentation for this visit. The documentation on 10/24/21 for the exam, diagnosis, procedures, and orders are all accurate and complete.   IF YOU HAVE BEEN REFERRED TO A SPECIALIST, IT MAY TAKE 1-2 WEEKS TO SCHEDULE/PROCESS THE REFERRAL. IF YOU HAVE NOT HEARD FROM US/SPECIALIST IN TWO WEEKS, PLEASE GIVE [place] A CALL AT [phone].   THE PATIENT IS ENCOURAGED TO PRACTICE SOCIAL DISTANCING DUE TO THE COVID-19 PANDEMIC.

## 2021-10-25 ENCOUNTER — Telehealth (HOSPITAL_BASED_OUTPATIENT_CLINIC_OR_DEPARTMENT_OTHER): Payer: Self-pay

## 2021-10-25 LAB — IRON,TIBC AND FERRITIN PANEL
Ferritin: 36 ng/mL (ref 15–150)
Iron Saturation: 32 % (ref 15–55)
Iron: 103 ug/dL (ref 27–159)
Total Iron Binding Capacity: 323 ug/dL (ref 250–450)
UIBC: 220 ug/dL (ref 131–425)

## 2021-10-25 LAB — CBC
Hematocrit: 36.6 % (ref 34.0–46.6)
Hemoglobin: 12.2 g/dL (ref 11.1–15.9)
MCH: 30.4 pg (ref 26.6–33.0)
MCHC: 33.3 g/dL (ref 31.5–35.7)
MCV: 91 fL (ref 79–97)
Platelets: 234 10*3/uL (ref 150–450)
RBC: 4.01 x10E6/uL (ref 3.77–5.28)
RDW: 12.9 % (ref 11.7–15.4)
WBC: 4.4 10*3/uL (ref 3.4–10.8)

## 2021-10-25 LAB — VITAMIN B12: Vitamin B-12: 962 pg/mL (ref 232–1245)

## 2021-10-26 ENCOUNTER — Telehealth (HOSPITAL_BASED_OUTPATIENT_CLINIC_OR_DEPARTMENT_OTHER): Payer: Self-pay

## 2022-01-15 ENCOUNTER — Ambulatory Visit (HOSPITAL_BASED_OUTPATIENT_CLINIC_OR_DEPARTMENT_OTHER)
Admission: RE | Admit: 2022-01-15 | Discharge: 2022-01-15 | Disposition: A | Discharge status: Home / Self Care | Payer: 59 | Source: Ambulatory Visit | Attending: Internal Medicine | Admitting: Internal Medicine

## 2022-01-15 ENCOUNTER — Encounter (HOSPITAL_BASED_OUTPATIENT_CLINIC_OR_DEPARTMENT_OTHER): Payer: Self-pay

## 2022-01-15 DIAGNOSIS — Z1231 Encounter for screening mammogram for malignant neoplasm of breast: Secondary | ICD-10-CM | POA: Insufficient documentation

## 2022-01-30 LAB — HM COLONOSCOPY

## 2022-05-15 ENCOUNTER — Ambulatory Visit: Payer: 59 | Admitting: Obstetrics & Gynecology

## 2022-05-29 ENCOUNTER — Other Ambulatory Visit (HOSPITAL_COMMUNITY)
Admission: RE | Admit: 2022-05-29 | Discharge: 2022-05-29 | Disposition: A | Discharge status: Home / Self Care | Payer: 59 | Source: Ambulatory Visit | Attending: Obstetrics & Gynecology | Admitting: Obstetrics & Gynecology

## 2022-05-29 ENCOUNTER — Ambulatory Visit (INDEPENDENT_AMBULATORY_CARE_PROVIDER_SITE_OTHER): Payer: 59 | Admitting: Obstetrics & Gynecology

## 2022-05-29 ENCOUNTER — Encounter: Payer: Self-pay | Admitting: Obstetrics & Gynecology

## 2022-05-29 VITALS — BP 131/90 | HR 69 | Ht 64.0 in | Wt 169.6 lb

## 2022-05-29 DIAGNOSIS — D219 Benign neoplasm of connective and other soft tissue, unspecified: Secondary | ICD-10-CM | POA: Diagnosis not present

## 2022-05-29 DIAGNOSIS — Z01419 Encounter for gynecological examination (general) (routine) without abnormal findings: Secondary | ICD-10-CM | POA: Insufficient documentation

## 2022-05-29 NOTE — Progress Notes (Signed)
Subjective:     Julie Robbins is a 42 y.o. female here for a routine exam.  Patient's last menstrual period was 05/09/2022. [id number] Birth Control Method:  none Menstrual Calendar(currently): regular, heavy days 2 + 3 no bad cramps, hx of anemia  Current complaints: fibroids, periods.   Current acute medical issues:  none   Recent Gynecologic History Patient's last menstrual period was 05/09/2022. Last Pap: 2021,  normal Last mammogram: 10/23,  normal  Past Medical History:  Diagnosis Date   Medical history non-contributory     Past Surgical History:  Procedure Laterality Date   CESAREAN SECTION N/A 02/11/2014   Procedure: CESAREAN SECTION;  Surgeon: [name], MD;  Location: [facility];  Service: Obstetrics;  Laterality: N/A;   CESAREAN SECTION N/A 12/20/2016   Procedure: REPEAT CESAREAN SECTION;  Surgeon: [name], MD;  Location: [facility];  Service: Obstetrics;  Laterality: N/A;   MYOMECTOMY  2006,2011,2014    OB History     Gravida  2   Para  2   Term  2   Preterm      AB      Living  2      SAB      IAB      Ectopic      Multiple  0   Live Births  2           Social History   Socioeconomic History   Marital status: Married    Spouse name: Not on file   Number of children: Not on file   Years of education: Not on file   Highest education level: Not on file  Occupational History   Not on file  Tobacco Use   Smoking status: Never   Smokeless tobacco: Never  Vaping Use   Vaping Use: Never used  Substance and Sexual Activity   Alcohol use: Yes    Comment: rarely   Drug use: No   Sexual activity: Yes    Birth control/protection: None  Other Topics Concern   Not on file  Social History Narrative   Not on file   Social Determinants of Health   Financial Resource Strain: Low Risk  (05/29/2022)   Overall Financial Resource Strain (CARDIA)    Difficulty of Paying Living Expenses: Not hard at all  Food Insecurity:  No Food Insecurity (05/29/2022)   Hunger Vital Sign    Worried About Running Out of Food in the Last Year: Never true    Ran Out of Food in the Last Year: Never true  Transportation Needs: No Transportation Needs (05/29/2022)   PRAPARE - Transportation    Lack of Transportation (Medical): No    Lack of Transportation (Non-Medical): No  Physical Activity: Inactive (05/29/2022)   Exercise Vital Sign    Days of Exercise per Week: 0 days    Minutes of Exercise per Session: 0 min  Stress: No Stress Concern Present (05/29/2022)   [place]    Feeling of Stress : Only a little  Social Connections: Socially Integrated (05/29/2022)   Social Connection and Isolation Panel [NHANES]    Frequency of Communication with Friends and Family: More than three times a week    Frequency of Social Gatherings with Friends and Family: Once a week    Attends Religious Services: More than 4 times per year    Active Member of [employer] or Organizations: Yes    Attends Archivist  Meetings: 1 to 4 times per year    Marital Status: Married    Family History  Problem Relation Age of Onset   Other Mother        degenerative joint disease   Hypertension Mother    Hypertension Maternal Grandmother    Other Maternal Grandmother        degenerative joint disease   Cancer Paternal Grandfather        multiple myloma   Colon polyps Father      Current Outpatient Medications:    Cholecalciferol 50 MCG (2000 UT) TABS, Take 2,000 Units by mouth at bedtime., Disp: , Rfl:    ferrous sulfate 325 (65 FE) MG tablet, Take 325 mg by mouth once., Disp: , Rfl:    ibuprofen (ADVIL,MOTRIN) 600 MG tablet, Take 1 tablet (600 mg total) by mouth every 6 (six) hours as needed., Disp: 30 tablet, Rfl: 0   Prenatal Vit-Fe Sulfate-FA (PRENATAL VITAMIN PO), Take 1 tablet by mouth at bedtime. , Disp: , Rfl:   Review of Systems  Review of Systems  Constitutional:  Negative for fever, chills, weight loss, malaise/fatigue and diaphoresis.  HENT: Negative for hearing loss, ear pain, nosebleeds, congestion, sore throat, neck pain, tinnitus and ear discharge.   Eyes: Negative for blurred vision, double vision, photophobia, pain, discharge and redness.  Respiratory: Negative for cough, hemoptysis, sputum production, shortness of breath, wheezing and stridor.   Cardiovascular: Negative for chest pain, palpitations, orthopnea, claudication, leg swelling and PND.  Gastrointestinal: negative for abdominal pain. Negative for heartburn, nausea, vomiting, diarrhea, constipation, blood in stool and melena.  Genitourinary: Negative for dysuria, urgency, frequency, hematuria and flank pain.  Musculoskeletal: Negative for myalgias, back pain, joint pain and falls.  Skin: Negative for itching and rash.  Neurological: Negative for dizziness, tingling, tremors, sensory change, speech change, focal weakness, seizures, loss of consciousness, weakness and headaches.  Endo/Heme/Allergies: Negative for environmental allergies and polydipsia. Does not bruise/bleed easily.  Psychiatric/Behavioral: Negative for depression, suicidal ideas, hallucinations, memory loss and substance abuse. The patient is not nervous/anxious and does not have insomnia.        Objective:  Blood pressure (!) 131/90, pulse 69, height 5' 4"$  (1.626 m), weight 169 lb 9.6 oz (76.9 kg), last menstrual period 05/09/2022.   Physical Exam  Vitals reviewed. Constitutional: She is oriented to person, place, and time. She appears well-developed and well-nourished.  HENT:  Head: Normocephalic and atraumatic.        Right Ear: External ear normal.  Left Ear: External ear normal.  Nose: Nose normal.  Mouth/Throat: Oropharynx is clear and moist.  Eyes: Conjunctivae and EOM are normal. Pupils are equal, round, and reactive to light. Right eye exhibits no discharge. Left eye exhibits no discharge. No scleral icterus.   Neck: Normal range of motion. Neck supple. No tracheal deviation present. No thyromegaly present.  Cardiovascular: Normal rate, regular rhythm, normal heart sounds and intact distal pulses.  Exam reveals no gallop and no friction rub.   No murmur heard. Respiratory: Effort normal and breath sounds normal. No respiratory distress. She has no wheezes. She has no rales. She exhibits no tenderness.  GI: Soft. Bowel sounds are normal. She exhibits no distension and no mass. There is no tenderness. There is no rebound and no guarding.  Genitourinary:  Breasts no masses skin changes or nipple changes bilaterally      Vulva is normal without lesions Vagina is pink moist without discharge Cervix normal in appearance and pap is done  Uterus is 18 weeks size Adnexa is negative with normal sized ovaries   Musculoskeletal: Normal range of motion. She exhibits no edema and no tenderness.  Neurological: She is alert and oriented to person, place, and time. She has normal reflexes. She displays normal reflexes. No cranial nerve deficit. She exhibits normal muscle tone. Coordination normal.  Skin: Skin is warm and dry. No rash noted. No erythema. No pallor.  Psychiatric: She has a normal mood and affect. Her behavior is normal. Judgment and thought content normal.       Medications Ordered at today's visit: No orders of the defined types were placed in this encounter.   Other orders placed at today's visit: No orders of the defined types were placed in this encounter.     Assessment:    Well woman gyn exam  18 week size fibroid uterus, mobile,  Hx of myomectomies x 2,  Hx of C sections X2   Plan:    Contraception: none. Follow up in: 1 month. Discuss robot assisted TLH approach      Return in about 1 month (around 06/27/2022) for Follow up, with Dr Elonda Husky: discuss robot assist hysterectomy.

## 2022-05-31 LAB — CYTOLOGY - PAP
Adequacy: ABSENT
Comment: NEGATIVE
Diagnosis: NEGATIVE
High risk HPV: NEGATIVE

## 2022-06-07 ENCOUNTER — Encounter: Payer: 59 | Admitting: Internal Medicine

## 2022-06-28 ENCOUNTER — Ambulatory Visit (INDEPENDENT_AMBULATORY_CARE_PROVIDER_SITE_OTHER): Payer: 59 | Admitting: Obstetrics & Gynecology

## 2022-06-28 ENCOUNTER — Encounter: Payer: Self-pay | Admitting: Obstetrics & Gynecology

## 2022-06-28 VITALS — BP 121/84 | HR 75 | Ht 64.0 in | Wt 172.0 lb

## 2022-06-28 DIAGNOSIS — D219 Benign neoplasm of connective and other soft tissue, unspecified: Secondary | ICD-10-CM

## 2022-06-28 DIAGNOSIS — N920 Excessive and frequent menstruation with regular cycle: Secondary | ICD-10-CM

## 2022-06-28 DIAGNOSIS — Z98891 History of uterine scar from previous surgery: Secondary | ICD-10-CM

## 2022-06-28 DIAGNOSIS — Z9889 Other specified postprocedural states: Secondary | ICD-10-CM | POA: Diagnosis not present

## 2022-06-28 DIAGNOSIS — N946 Dysmenorrhea, unspecified: Secondary | ICD-10-CM

## 2022-07-05 ENCOUNTER — Encounter: Payer: Self-pay | Admitting: Obstetrics & Gynecology

## 2022-07-11 ENCOUNTER — Encounter: Payer: Self-pay | Admitting: Obstetrics & Gynecology

## 2022-07-23 ENCOUNTER — Encounter: Payer: Self-pay | Admitting: Internal Medicine

## 2022-07-23 ENCOUNTER — Ambulatory Visit (INDEPENDENT_AMBULATORY_CARE_PROVIDER_SITE_OTHER): Payer: 59 | Admitting: Internal Medicine

## 2022-07-23 VITALS — BP 108/68 | HR 65 | Temp 98.6°F | Ht 64.0 in | Wt 169.6 lb

## 2022-07-23 DIAGNOSIS — Z862 Personal history of diseases of the blood and blood-forming organs and certain disorders involving the immune mechanism: Secondary | ICD-10-CM

## 2022-07-23 DIAGNOSIS — D259 Leiomyoma of uterus, unspecified: Secondary | ICD-10-CM

## 2022-07-23 DIAGNOSIS — Z0000 Encounter for general adult medical examination without abnormal findings: Secondary | ICD-10-CM

## 2022-07-23 NOTE — Progress Notes (Signed)
Hershal Coria Martin,acting as a Neurosurgeon for Gwynneth Aliment, MD.,have documented all relevant documentation on the behalf of Gwynneth Aliment, MD,as directed by  Gwynneth Aliment, MD while in the presence of Gwynneth Aliment, MD.   Subjective:     Patient ID: Julie Robbins , female    DOB: 11/03/1980 , 42 y.o.   MRN: [id number]   Chief Complaint  Patient presents with   Annual Exam    HPI  Patient presents today for a physical. She sees [facility] for her GYN care. Her last pap smear and mammogram was February.  She is scheduled for hysterectomy in June 2024.  She does not have any questions or concerns today.  BP Readings from Last 3 Encounters: 07/23/22 : 108/68 06/28/22 : 121/84 05/29/22 : Marland Kitchen 131/90       Past Medical History:  Diagnosis Date   Medical history non-contributory      Family History  Problem Relation Age of Onset   Other Mother        degenerative joint disease   Hypertension Mother    Hypertension Maternal Grandmother    Other Maternal Grandmother        degenerative joint disease   Cancer Paternal Grandfather        multiple myloma   Colon polyps Father      Current Outpatient Medications:    Cholecalciferol 50 MCG (2000 UT) TABS, Take 2,000 Units by mouth at bedtime., Disp: , Rfl:    ferrous sulfate 325 (65 FE) MG tablet, Take 325 mg by mouth once., Disp: , Rfl:    ibuprofen (ADVIL,MOTRIN) 600 MG tablet, Take 1 tablet (600 mg total) by mouth every 6 (six) hours as needed., Disp: 30 tablet, Rfl: 0   Prenatal Vit-Fe Sulfate-FA (PRENATAL VITAMIN PO), Take 1 tablet by mouth at bedtime. , Disp: , Rfl:    Allergies  Allergen Reactions   Doxycycline Hyclate     REACTION: Lip swelling      The patient states she uses none for birth control. Last LMP was Patient's last menstrual period was 07/08/2022.. Negative for Menorrhagia. Negative for: breast discharge, breast lump(s), breast pain and breast self exam. Associated symptoms include  abnormal vaginal bleeding. Pertinent negatives include abnormal bleeding (hematology), anxiety, decreased libido, depression, difficulty falling sleep, dyspareunia, history of infertility, nocturia, sexual dysfunction, sleep disturbances, urinary incontinence, urinary urgency, vaginal discharge and vaginal itching. Diet regular.The patient states her exercise level is    . The patient's tobacco use is:  Social History   Tobacco Use  Smoking Status Never  Smokeless Tobacco Never  . She has been exposed to passive smoke. The patient's alcohol use is:  Social History   Substance and Sexual Activity  Alcohol Use Yes   Comment: rarely    Review of Systems  Constitutional: Negative.   HENT: Negative.    Eyes: Negative.   Respiratory: Negative.    Cardiovascular: Negative.   Gastrointestinal: Negative.   Endocrine: Negative.   Genitourinary: Negative.   Musculoskeletal: Negative.   Skin: Negative.   Allergic/Immunologic: Negative.   Neurological: Negative.   Hematological: Negative.   Psychiatric/Behavioral: Negative.       Today's Vitals   07/23/22 0915  BP: 108/68  Pulse: 65  Temp: 98.6 F (37 C)  TempSrc: Oral  Weight: 169 lb 9.6 oz (76.9 kg)  Height: 5\' 4"  (1.626 m)  PainSc: 0-No pain   Body mass index is 29.11 kg/m.  Wt Readings from Last  3 Encounters:  07/23/22 169 lb 9.6 oz (76.9 kg)  06/28/22 172 lb (78 kg)  05/29/22 169 lb 9.6 oz (76.9 kg)    Objective:  Physical Exam Vitals and nursing note reviewed.  Constitutional:      Appearance: Normal appearance.  HENT:     Head: Normocephalic and atraumatic.     Right Ear: Tympanic membrane, ear canal and external ear normal.     Left Ear: Tympanic membrane, ear canal and external ear normal.     Mouth/Throat:     Mouth: Mucous membranes are moist.     Pharynx: Oropharynx is clear.  Eyes:     Extraocular Movements: Extraocular movements intact.     Conjunctiva/sclera: Conjunctivae normal.     Pupils: Pupils  are equal, round, and reactive to light.  Cardiovascular:     Rate and Rhythm: Normal rate and regular rhythm.     Pulses: Normal pulses.     Heart sounds: Normal heart sounds.  Pulmonary:     Effort: Pulmonary effort is normal.     Breath sounds: Normal breath sounds.  Chest:  Breasts:    Tanner Score is 5.     Comments: Not performed, she kept bra on Abdominal:     General: Bowel sounds are normal.     Palpations: Abdomen is soft.     Comments: Firm pelvic mass, up to umbilicus  Genitourinary:    Comments: deferred Musculoskeletal:        General: Normal range of motion.     Cervical back: Normal range of motion and neck supple.  Skin:    General: Skin is warm and dry.  Neurological:     General: No focal deficit present.     Mental Status: She is alert and oriented to person, place, and time.  Psychiatric:        Mood and Affect: Mood normal.        Behavior: Behavior normal.      Assessment And Plan:     1. Encounter for general adult medical examination w/o abnormal findings Comments: A full exam was performed. She is encouraged to perform monthly self breast exams.  PATIENT IS ADVISED TO GET 30-45 MINUTES REGULAR EXERCISE NO LESS THAN FOUR TO FIVE DAYS PER WEEK - BOTH WEIGHTBEARING EXERCISES AND AEROBIC ARE RECOMMENDED.  PATIENT IS ADVISED TO FOLLOW A HEALTHY DIET WITH AT LEAST SIX FRUITS/VEGGIES PER DAY, DECREASE INTAKE OF RED MEAT, AND TO INCREASE FISH INTAKE TO TWO DAYS PER WEEK.  MEATS/FISH SHOULD NOT BE FRIED, BAKED OR BROILED IS PREFERABLE.  IT IS ALSO IMPORTANT TO CUT BACK ON YOUR SUGAR INTAKE. PLEASE AVOID ANYTHING WITH ADDED SUGAR, CORN SYRUP OR OTHER SWEETENERS. IF YOU MUST USE A SWEETENER, YOU CAN TRY STEVIA. IT IS ALSO IMPORTANT TO AVOID ARTIFICIALLY SWEETENERS AND DIET BEVERAGES. LASTLY, I SUGGEST WEARING SPF 50 SUNSCREEN ON EXPOSED PARTS AND ESPECIALLY WHEN IN THE DIRECT SUNLIGHT FOR AN EXTENDED PERIOD OF TIME.  PLEASE AVOID FAST FOOD RESTAURANTS AND INCREASE  YOUR WATER INTAKE. - CMP14+EGFR - CBC - Lipid panel - Hemoglobin A1c - Iron, TIBC and Ferritin Panel  2. Uterine leiomyoma, unspecified location Comments: GYN notes reviewed in detail. She is scheduled for hysterectomy, possibly robotic in June 2024.    3. History of iron deficiency anemia Comments: She had nl colonoscopy in Fall 2023. Sx likely due to menorrhagia. I will check levels today.     Patient was given opportunity to ask questions. Patient verbalized understanding of the  plan and was able to repeat key elements of the plan. All questions were answered to their satisfaction.   I, Gwynneth Aliment, MD, have reviewed all documentation for this visit. The documentation on 07/23/22 for the exam, diagnosis, procedures, and orders are all accurate and complete.   THE PATIENT IS ENCOURAGED TO PRACTICE SOCIAL DISTANCING DUE TO THE COVID-19 PANDEMIC.

## 2022-07-23 NOTE — Patient Instructions (Signed)

## 2022-07-24 ENCOUNTER — Other Ambulatory Visit: Payer: Self-pay | Admitting: Internal Medicine

## 2022-07-24 ENCOUNTER — Encounter: Payer: Self-pay | Admitting: Internal Medicine

## 2022-07-24 LAB — CMP14+EGFR
ALT: 21 IU/L (ref 0–32)
AST: 17 IU/L (ref 0–40)
Albumin/Globulin Ratio: 1.8 (ref 1.2–2.2)
Albumin: 4.4 g/dL (ref 3.9–4.9)
Alkaline Phosphatase: 45 IU/L (ref 44–121)
BUN/Creatinine Ratio: 25 — ABNORMAL HIGH (ref 9–23)
BUN: 15 mg/dL (ref 6–24)
Bilirubin Total: 0.2 mg/dL (ref 0.0–1.2)
CO2: 19 mmol/L — ABNORMAL LOW (ref 20–29)
Calcium: 9.2 mg/dL (ref 8.7–10.2)
Chloride: 105 mmol/L (ref 96–106)
Creatinine, Ser: 0.61 mg/dL (ref 0.57–1.00)
Globulin, Total: 2.5 g/dL (ref 1.5–4.5)
Glucose: 86 mg/dL (ref 70–99)
Potassium: 4.5 mmol/L (ref 3.5–5.2)
Sodium: 139 mmol/L (ref 134–144)
Total Protein: 6.9 g/dL (ref 6.0–8.5)
eGFR: 115 mL/min/{1.73_m2} (ref >59)

## 2022-07-24 LAB — CBC
Hematocrit: 37.5 % (ref 34.0–46.6)
Hemoglobin: 12.4 g/dL (ref 11.1–15.9)
MCH: 30.9 pg (ref 26.6–33.0)
MCHC: 33.1 g/dL (ref 31.5–35.7)
MCV: 94 fL (ref 79–97)
Platelets: 233 x10E3/uL (ref 150–450)
RBC: 4.01 x10E6/uL (ref 3.77–5.28)
RDW: 12.8 % (ref 11.7–15.4)
WBC: 4 x10E3/uL (ref 3.4–10.8)

## 2022-07-24 LAB — LIPID PANEL
Chol/HDL Ratio: 2.7 ratio (ref 0.0–4.4)
Cholesterol, Total: 137 mg/dL (ref 100–199)
HDL: 51 mg/dL (ref >39)
LDL Chol Calc (NIH): 74 mg/dL (ref 0–99)
Triglycerides: 55 mg/dL (ref 0–149)
VLDL Cholesterol Cal: 12 mg/dL (ref 5–40)

## 2022-07-24 LAB — IRON,TIBC AND FERRITIN PANEL
Ferritin: 34 ng/mL (ref 15–150)
Iron Saturation: 40 % (ref 15–55)
Iron: 141 ug/dL (ref 27–159)
Total Iron Binding Capacity: 356 ug/dL (ref 250–450)
UIBC: 215 ug/dL (ref 131–425)

## 2022-07-24 LAB — HEMOGLOBIN A1C
Est. average glucose Bld gHb Est-mCnc: 105 mg/dL
Hgb A1c MFr Bld: 5.3 % (ref 4.8–5.6)

## 2022-07-25 LAB — TSH: TSH: 1.5 u[IU]/mL (ref 0.450–4.500)

## 2022-07-25 LAB — SPECIMEN STATUS REPORT

## 2022-07-29 NOTE — Progress Notes (Signed)
Follow up appointment for discussion: Fibroids/menorrhagia/dysmenorrhea  Chief Complaint  Patient presents with   Pre-op Exam    Discuss robotic hysterectomy    Blood pressure 121/84, pulse 75, height  (1.626 m), weight 172 lb (78 kg), last menstrual period 05/09/2022.  Julie Robbins and I discussed the issues regarding her fibroids.  This pelvic abdominal pain during her last visit which was her yearly exam She certainly presents a couple of challenges, namely the previous myomectomies x 2 and currently a enlarged fibroid uterus up to 18 weeks size  The possibility of robotic hysterectomy is present but the patient also is aware because of adhesions that it may be unable to be done robotically  We will need to put in a GelPort mini and do an extracorporeal myomectomy in order to remove the fibroids she understands that her previous surgeries including her 2 6 C-sections may preclude this approach  On exam her uterus is mobile and there is room side-to-side which makes me think I can use a fifth arm or a fourth robotic arm to perform uterine manipulation.  She does not have pain with intercourse and if I am able to do the hysterectomy but need to leave the cervix she is comfortable with that approach.  She is going to look at her schedule and come up with some dates that work for [employer] probably can be after the kids get out of school  MEDS ordered this encounter: No orders of the defined types were placed in this encounter.   Orders for this encounter: No orders of the defined types were placed in this encounter.   Impression + Management Plan   ICD-10-CM   1. Fibroids, 18 weeks size, fills pelvis side to side  D21.9     2. History of myomectomy x 2  Z98.890     3. Previous cesarean section x 2  Z98.891     4. Dysmenorrhea  N94.6     5. Menorrhagia with regular cycle  N92.0       Follow Up: Patient to decide on surgical timing of robotic assisted laparoscopic  hysterectomy     All questions were answered.  Past Medical History:  Diagnosis Date   Medical history non-contributory     Past Surgical History:  Procedure Laterality Date   CESAREAN SECTION N/A 02/11/2014   Procedure: CESAREAN SECTION;  Surgeon: Tilda Burrow, MD;  Location: [facility];  Service: Obstetrics;  Laterality: N/A;   CESAREAN SECTION N/A 12/20/2016   Procedure: REPEAT CESAREAN SECTION;  Surgeon: Tilda Burrow, MD;  Location: [facility];  Service: Obstetrics;  Laterality: N/A;   MYOMECTOMY  2006,2011,2014    OB History     Gravida  2   Para  2   Term  2   Preterm      AB      Living  2      SAB      IAB      Ectopic      Multiple  0   Live Births  2           Allergies  Allergen Reactions   Doxycycline Hyclate     REACTION: Lip swelling    Social History   Socioeconomic History   Marital status: Married    Spouse name: Not on file   Number of children: Not on file   Years of education: Not on file   Highest education level: Not on file  Occupational History  Not on file  Tobacco Use   Smoking status: Never   Smokeless tobacco: Never  Vaping Use   Vaping Use: Never used  Substance and Sexual Activity   Alcohol use: Yes    Comment: rarely   Drug use: No   Sexual activity: Yes    Birth control/protection: None  Other Topics Concern   Not on file  Social History Narrative   Not on file   Social Determinants of Health   Financial Resource Strain: Low Risk  (05/29/2022)   Overall Financial Resource Strain (CARDIA)    Difficulty of Paying Living Expenses: Not hard at all  Food Insecurity: No Food Insecurity (05/29/2022)   Hunger Vital Sign    Worried About Running Out of Food in the Last Year: Never true    Ran Out of Food in the Last Year: Never true  Transportation Needs: No Transportation Needs (05/29/2022)   PRAPARE - Administrator, Civil Service (Medical): No    Lack of Transportation  (Non-Medical): No  Physical Activity: Inactive (05/29/2022)   Exercise Vital Sign    Days of Exercise per Week: 0 days    Minutes of Exercise per Session: 0 min  Stress: No Stress Concern Present (05/29/2022)   [employer] of Occupational Health - Occupational Stress Questionnaire    Feeling of Stress : Only a little  Social Connections: Socially Integrated (05/29/2022)   Social Connection and Isolation Panel [NHANES]    Frequency of Communication with Friends and Family: More than three times a week    Frequency of Social Gatherings with Friends and Family: Once a week    Attends Religious Services: More than 4 times per year    Active Member of [employer] or Organizations: Yes    Attends Banker Meetings: 1 to 4 times per year    Marital Status: Married    Family History  Problem Relation Age of Onset   Other Mother        degenerative joint disease   Hypertension Mother    Hypertension Maternal Grandmother    Other Maternal Grandmother        degenerative joint disease   Cancer Paternal Grandfather        multiple myloma   Colon polyps Father

## 2022-08-16 ENCOUNTER — Other Ambulatory Visit: Payer: Self-pay | Admitting: Obstetrics & Gynecology

## 2022-08-16 DIAGNOSIS — D219 Benign neoplasm of connective and other soft tissue, unspecified: Secondary | ICD-10-CM

## 2022-08-17 ENCOUNTER — Other Ambulatory Visit: Payer: Self-pay | Admitting: Obstetrics & Gynecology

## 2022-08-17 ENCOUNTER — Ambulatory Visit (INDEPENDENT_AMBULATORY_CARE_PROVIDER_SITE_OTHER): Payer: 59

## 2022-08-17 DIAGNOSIS — D219 Benign neoplasm of connective and other soft tissue, unspecified: Secondary | ICD-10-CM

## 2022-08-17 NOTE — Progress Notes (Signed)
PELVIC US TA only: enlarged heterogeneous anteverted uterus with multiple fibroids,largest fibroids:(#1) lower uterine segment subserosal fibroid 9.4 x 8.7 x 8.7 cm,(#2) fundal subserosal fibroid 7 x 5.2 x 4.8 cm,(#3) posterior submucosal 4.9 x 3.5 x 4.4 cm,EEC 8 mm,normal ovaries,no free fluid

## 2022-09-09 ENCOUNTER — Other Ambulatory Visit: Payer: Self-pay | Admitting: Obstetrics & Gynecology

## 2022-09-09 DIAGNOSIS — Z01818 Encounter for other preprocedural examination: Secondary | ICD-10-CM

## 2022-09-14 NOTE — Patient Instructions (Signed)
Julie Robbins  09/14/2022     @PREFPERIOPPHARMACY @   Your procedure is scheduled on  09/19/2022.   Report to [name] at  1005  A.M.   Call this number if you have problems the morning of surgery:  [phone]  If you experience any cold or flu symptoms such as cough, fever, chills, shortness of breath, etc. between now and your scheduled surgery, please notify us at the above number.   Remember:  Do not eat after midnight.    You may drink clear liquids until 0600 am on 09/19/2022.        Clear liquids allowed are:                    Water, Juice (No red color; non-citric and without pulp; diabetics please choose diet or no sugar options), Carbonated beverages (diabetics please choose diet or no sugar options), Clear Tea (No creamer, milk, or cream, including half & half and powdered creamer), Black Coffee Only (No creamer, milk or cream, including half & half and powdered creamer), Plain Jell-O Only (No red color; diabetics please choose no sugar options), Clear Sports drink (No red color; diabetics please choose diet or no sugar options), and Plain Popsicles Only (No red color; diabetics please choose no sugar options)       At 0600 am on 09/19/2022 drink your carb drink. You can have nothing else to drink after this.    Take these medicines the morning of surgery with A SIP OF WATER                                                         None        Do not wear jewelry, make-up or nail polish, including gel polish,  artificial nails, or any other type of covering on natural nails (fingers and  toes).  Do not wear lotions, powders, or perfumes, or deodorant.  Do not shave 48 hours prior to surgery.  Men may shave face and neck.  Do not bring valuables to the hospital.  [facility] is not responsible for any belongings or valuables.  Contacts, dentures or bridgework may not be worn into surgery.  Leave your suitcase in the car.  After surgery it may be  brought to your room.  For patients admitted to the hospital, discharge time will be determined by your treatment team.  Patients discharged the day of surgery will not be allowed to drive home and must have someone with them for 24 hours.    Special instructions:   DO NOT smoke tobacco or vape for 24 hours before your procedure.  Please read over the following fact sheets that you were given. Pain Booklet, Coughing and Deep Breathing, Blood Transfusion Information, Surgical Site Infection Prevention, Anesthesia Post-op Instructions, and Care and Recovery After Surgery      Total Laparoscopic Hysterectomy, Care After The following information offers guidance on how to care for yourself after your procedure. Your health care provider may also give you more specific instructions. If you have problems or questions, contact your health care provider. What can I expect after the procedure? After the procedure, it is common to have: Pain, bruising, and numbness around your incisions. Tiredness (fatigue). Poor appetite. Less  interest in sex. Vaginal discharge or bleeding. You will need to use a sanitary pad after this procedure. Feelings of sadness or other emotions. If your ovaries were also removed, it is also common to have symptoms of menopause, such as hot flashes, night sweats, and lack of sleep (insomnia). Follow these instructions at home: Medicines Take over-the-counter and prescription medicines only as told by your health care provider. Ask your health care provider if the medicine prescribed to you: Requires you to avoid driving or using machinery. Can cause constipation. You may need to take these actions to prevent or treat constipation: Drink enough fluid to keep your urine pale yellow. Take over-the-counter or prescription medicines. Eat foods that are high in fiber, such as beans, whole grains, and fresh fruits and vegetables. Limit foods that are high in fat and processed  sugars, such as fried or sweet foods. Incision care  Follow instructions from your health care provider about how to take care of your incisions. Make sure you: Wash your hands with soap and water for at least 20 seconds before and after you change your bandage (dressing). If soap and water are not available, use hand sanitizer. Change your dressing as told by your health care provider. Leave stitches (sutures), skin glue, or adhesive strips in place. These skin closures may need to stay in place for 2 weeks or longer. If adhesive strip edges start to loosen and curl up, you may trim the loose edges. Do not remove adhesive strips completely unless your health care provider tells you to do that. Check your incision areas every day for signs of infection. Check for: More redness, swelling, or pain. Fluid or blood. Warmth. Pus or a bad smell. Activity  Rest as told by your health care provider. Avoid sitting for a long time without moving. Get up to take short walks every 1-2 hours. This is important to improve blood flow and breathing. Ask for help if you feel weak or unsteady. Return to your normal activities as told by your health care provider. Ask your health care provider what activities are safe for you. Do not lift anything that is heavier than 10 lb (4.5 kg), or the limit that you are told, for one month after surgery or until your health care provider says that it is safe. If you were given a sedative during the procedure, it can affect you for several hours. Do not drive or operate machinery until your health care provider says that it is safe. Lifestyle Do not use any products that contain nicotine or tobacco. These products include cigarettes, chewing tobacco, and vaping devices, such as e-cigarettes. These can delay healing after surgery. If you need help quitting, ask your health care provider. Do not drink alcohol until your health care provider approves. General instructions  Do  not douche, use tampons, or have sex for at least 6 weeks, or as told by your health care provider. If you struggle with physical or emotional changes after your procedure, speak with your health care provider or a therapist. Do not take baths, swim, or use a hot tub until your health care provider approves. You may only be allowed to take showers for 2-3 weeks. Keep your dressing dry until your health care provider says it can be removed. Try to have someone at home with you for the first 1-2 weeks to help with your daily chores. Wear compression stockings as told by your health care provider. These stockings help to prevent blood clots  and reduce swelling in your legs. Keep all follow-up visits. This is important. Contact a health care provider if: You have any of these signs of infection: Chills or a fever. More redness, swelling, or pain around an incision. Fluid or blood coming from an incision. Warmth coming from an incision. Pus or a bad smell coming from an incision. An incision opens. You feel dizzy or light-headed. You have pain or bleeding when you urinate, or you are unable to urinate. You have abnormal vaginal discharge. You have pain that does not get better with medicine. Get help right away if: You have a fever and your symptoms suddenly get worse. You have severe abdominal pain. You have chest pain or shortness of breath. You faint. You have pain, swelling, or redness in your leg. You have heavy vaginal bleeding with blood clots, soaking through a sanitary pad in less than 1 hour. These symptoms may represent a serious problem that is an emergency. Do not wait to see if the symptoms will go away. Get medical help right away. Call your local emergency services (911 in the [place] Do not drive yourself to the hospital. Summary After the procedure, it is common to have pain and bruising around your incisions. Do not take baths, swim, or use a hot tub until your health care  provider approves. Do not lift anything that is heavier than 10 lb (4.5 kg), or the limit that you are told, for one month after surgery or until your health care provider says that it is safe. Tell your health care provider if you have any signs or symptoms of infection after the procedure. Get help right away if you have severe abdominal pain, chest pain, shortness of breath, or heavy bleeding from your vagina. This information is not intended to replace advice given to you by your health care provider. Make sure you discuss any questions you have with your health care provider. Document Revised: 11/26/2019 Document Reviewed: 11/27/2019 Elsevier Patient Education  2024 [employer] General Anesthesia, Adult, Care After The following information offers guidance on how to care for yourself after your procedure. Your health care provider may also give you more specific instructions. If you have problems or questions, contact your health care provider. What can I expect after the procedure? After the procedure, it is common for people to: Have pain or discomfort at the IV site. Have nausea or vomiting. Have a sore throat or hoarseness. Have trouble concentrating. Feel cold or chills. Feel weak, sleepy, or tired (fatigue). Have soreness and body aches. These can affect parts of the body that were not involved in surgery. Follow these instructions at home: For the time period you were told by your health care provider:  Rest. Do not participate in activities where you could fall or become injured. Do not drive or use machinery. Do not drink alcohol. Do not take sleeping pills or medicines that cause drowsiness. Do not make important decisions or sign legal documents. Do not take care of children on your own. General instructions Drink enough fluid to keep your urine pale yellow. If you have sleep apnea, surgery and certain medicines can increase your risk for breathing problems. Follow  instructions from your health care provider about wearing your sleep device: Anytime you are sleeping, including during daytime naps. While taking prescription pain medicines, sleeping medicines, or medicines that make you drowsy. Return to your normal activities as told by your health care provider. Ask your health care provider what activities are  safe for you. Take over-the-counter and prescription medicines only as told by your health care provider. Do not use any products that contain nicotine or tobacco. These products include cigarettes, chewing tobacco, and vaping devices, such as e-cigarettes. These can delay incision healing after surgery. If you need help quitting, ask your health care provider. Contact a health care provider if: You have nausea or vomiting that does not get better with medicine. You vomit every time you eat or drink. You have pain that does not get better with medicine. You cannot urinate or have bloody urine. You develop a skin rash. You have a fever. Get help right away if: You have trouble breathing. You have chest pain. You vomit blood. These symptoms may be an emergency. Get help right away. Call 911. Do not wait to see if the symptoms will go away. Do not drive yourself to the hospital. Summary After the procedure, it is common to have a sore throat, hoarseness, nausea, vomiting, or to feel weak, sleepy, or fatigue. For the time period you were told by your health care provider, do not drive or use machinery. Get help right away if you have difficulty breathing, have chest pain, or vomit blood. These symptoms may be an emergency. This information is not intended to replace advice given to you by your health care provider. Make sure you discuss any questions you have with your health care provider. Document Revised: 06/23/2021 Document Reviewed: 06/23/2021 Elsevier Patient Education  2024 [employer] How to Use Chlorhexidine Before  Surgery Chlorhexidine gluconate (CHG) is a germ-killing (antiseptic) solution that is used to clean the skin. It can get rid of the bacteria that normally live on the skin and can keep them away for about 24 hours. To clean your skin with CHG, you may be given: A CHG solution to use in the shower or as part of a sponge bath. A prepackaged cloth that contains CHG. Cleaning your skin with CHG may help lower the risk for infection: While you are staying in the intensive care unit of the hospital. If you have a vascular access, such as a central line, to provide short-term or long-term access to your veins. If you have a catheter to drain urine from your bladder. If you are on a ventilator. A ventilator is a machine that helps you breathe by moving air in and out of your lungs. After surgery. What are the risks? Risks of using CHG include: A skin reaction. Hearing loss, if CHG gets in your ears and you have a perforated eardrum. Eye injury, if CHG gets in your eyes and is not rinsed out. The CHG product catching fire. Make sure that you avoid smoking and flames after applying CHG to your skin. Do not use CHG: If you have a chlorhexidine allergy or have previously reacted to chlorhexidine. On babies younger than 46 months of age. How to use CHG solution Use CHG only as told by your health care provider, and follow the instructions on the label. Use the full amount of CHG as directed. Usually, this is one bottle. During a shower Follow these steps when using CHG solution during a shower (unless your health care provider gives you different instructions): Start the shower. Use your normal soap and shampoo to wash your face and hair. Turn off the shower or move out of the shower stream. Pour the CHG onto a clean washcloth. Do not use any type of brush or rough-edged sponge. Starting at your neck, lather your  body down to your toes. Make sure you follow these instructions: If you will be having  surgery, pay special attention to the part of your body where you will be having surgery. Scrub this area for at least 1 minute. Do not use CHG on your head or face. If the solution gets into your ears or eyes, rinse them well with water. Avoid your genital area. Avoid any areas of skin that have broken skin, cuts, or scrapes. Scrub your back and under your arms. Make sure to wash skin folds. Let the lather sit on your skin for 1-2 minutes or as long as told by your health care provider. Thoroughly rinse your entire body in the shower. Make sure that all body creases and crevices are rinsed well. Dry off with a clean towel. Do not put any substances on your body afterward--such as powder, lotion, or perfume--unless you are told to do so by your health care provider. Only use lotions that are recommended by the manufacturer. Put on clean clothes or pajamas. If it is the night before your surgery, sleep in clean sheets.  During a sponge bath Follow these steps when using CHG solution during a sponge bath (unless your health care provider gives you different instructions): Use your normal soap and shampoo to wash your face and hair. Pour the CHG onto a clean washcloth. Starting at your neck, lather your body down to your toes. Make sure you follow these instructions: If you will be having surgery, pay special attention to the part of your body where you will be having surgery. Scrub this area for at least 1 minute. Do not use CHG on your head or face. If the solution gets into your ears or eyes, rinse them well with water. Avoid your genital area. Avoid any areas of skin that have broken skin, cuts, or scrapes. Scrub your back and under your arms. Make sure to wash skin folds. Let the lather sit on your skin for 1-2 minutes or as long as told by your health care provider. Using a different clean, wet washcloth, thoroughly rinse your entire body. Make sure that all body creases and crevices are  rinsed well. Dry off with a clean towel. Do not put any substances on your body afterward--such as powder, lotion, or perfume--unless you are told to do so by your health care provider. Only use lotions that are recommended by the manufacturer. Put on clean clothes or pajamas. If it is the night before your surgery, sleep in clean sheets. How to use CHG prepackaged cloths Only use CHG cloths as told by your health care provider, and follow the instructions on the label. Use the CHG cloth on clean, dry skin. Do not use the CHG cloth on your head or face unless your health care provider tells you to. When washing with the CHG cloth: Avoid your genital area. Avoid any areas of skin that have broken skin, cuts, or scrapes. Before surgery Follow these steps when using a CHG cloth to clean before surgery (unless your health care provider gives you different instructions): Using the CHG cloth, vigorously scrub the part of your body where you will be having surgery. Scrub using a back-and-forth motion for 3 minutes. The area on your body should be completely wet with CHG when you are done scrubbing. Do not rinse. Discard the cloth and let the area air-dry. Do not put any substances on the area afterward, such as powder, lotion, or perfume. Put on clean clothes  or pajamas. If it is the night before your surgery, sleep in clean sheets.  For general bathing Follow these steps when using CHG cloths for general bathing (unless your health care provider gives you different instructions). Use a separate CHG cloth for each area of your body. Make sure you wash between any folds of skin and between your fingers and toes. Wash your body in the following order, switching to a new cloth after each step: The front of your neck, shoulders, and chest. Both of your arms, under your arms, and your hands. Your stomach and groin area, avoiding the genitals. Your right leg and foot. Your left leg and foot. The back of  your neck, your back, and your buttocks. Do not rinse. Discard the cloth and let the area air-dry. Do not put any substances on your body afterward--such as powder, lotion, or perfume--unless you are told to do so by your health care provider. Only use lotions that are recommended by the manufacturer. Put on clean clothes or pajamas. Contact a health care provider if: Your skin gets irritated after scrubbing. You have questions about using your solution or cloth. You swallow any chlorhexidine. Call your local poison control center (310-151-4535 in the [place] Get help right away if: Your eyes itch badly, or they become very red or swollen. Your skin itches badly and is red or swollen. Your hearing changes. You have trouble seeing. You have swelling or tingling in your mouth or throat. You have trouble breathing. These symptoms may represent a serious problem that is an emergency. Do not wait to see if the symptoms will go away. Get medical help right away. Call your local emergency services (911 in the [place] Do not drive yourself to the hospital. Summary Chlorhexidine gluconate (CHG) is a germ-killing (antiseptic) solution that is used to clean the skin. Cleaning your skin with CHG may help to lower your risk for infection. You may be given CHG to use for bathing. It may be in a bottle or in a prepackaged cloth to use on your skin. Carefully follow your health care provider's instructions and the instructions on the product label. Do not use CHG if you have a chlorhexidine allergy. Contact your health care provider if your skin gets irritated after scrubbing. This information is not intended to replace advice given to you by your health care provider. Make sure you discuss any questions you have with your health care provider. Document Revised: 07/24/2021 Document Reviewed: 06/06/2020 Elsevier Patient Education  2023 [employer].

## 2022-09-17 ENCOUNTER — Other Ambulatory Visit: Payer: Self-pay

## 2022-09-17 ENCOUNTER — Encounter (HOSPITAL_COMMUNITY)
Admission: RE | Admit: 2022-09-17 | Discharge: 2022-09-17 | Disposition: A | Discharge status: Home / Self Care | Payer: 59 | Source: Ambulatory Visit | Attending: Obstetrics & Gynecology | Admitting: Obstetrics & Gynecology

## 2022-09-17 ENCOUNTER — Encounter (HOSPITAL_COMMUNITY): Payer: Self-pay

## 2022-09-17 DIAGNOSIS — Z01818 Encounter for other preprocedural examination: Secondary | ICD-10-CM | POA: Insufficient documentation

## 2022-09-17 LAB — CBC
HCT: 35.8 % — ABNORMAL LOW (ref 36.0–46.0)
Hemoglobin: 11.8 g/dL — ABNORMAL LOW (ref 12.0–15.0)
MCH: 30.5 pg (ref 26.0–34.0)
MCHC: 33 g/dL (ref 30.0–36.0)
MCV: 92.5 fL (ref 80.0–100.0)
Platelets: 230 10*3/uL (ref 150–400)
RBC: 3.87 MIL/uL (ref 3.87–5.11)
RDW: 12.7 % (ref 11.5–15.5)
WBC: 3.7 10*3/uL — ABNORMAL LOW (ref 4.0–10.5)
nRBC: 0 % (ref 0.0–0.2)

## 2022-09-17 LAB — URINALYSIS, ROUTINE W REFLEX MICROSCOPIC
Bilirubin Urine: NEGATIVE
Glucose, UA: NEGATIVE mg/dL
Hgb urine dipstick: NEGATIVE
Ketones, ur: 20 mg/dL — AB
Leukocytes,Ua: NEGATIVE
Nitrite: NEGATIVE
Protein, ur: NEGATIVE mg/dL
Specific Gravity, Urine: 1.014 (ref 1.005–1.030)
pH: 6 (ref 5.0–8.0)

## 2022-09-17 LAB — COMPREHENSIVE METABOLIC PANEL
ALT: 28 U/L (ref 0–44)
AST: 22 U/L (ref 15–41)
Albumin: 4.1 g/dL (ref 3.5–5.0)
Alkaline Phosphatase: 42 U/L (ref 38–126)
Anion gap: 8 (ref 5–15)
BUN: 11 mg/dL (ref 6–20)
CO2: 23 mmol/L (ref 22–32)
Calcium: 8.8 mg/dL — ABNORMAL LOW (ref 8.9–10.3)
Chloride: 105 mmol/L (ref 98–111)
Creatinine, Ser: 0.53 mg/dL (ref 0.44–1.00)
GFR, Estimated: >60 mL/min (ref >60)
Glucose, Bld: 114 mg/dL — ABNORMAL HIGH (ref 70–99)
Potassium: 3.6 mmol/L (ref 3.5–5.1)
Sodium: 136 mmol/L (ref 135–145)
Total Bilirubin: 0.4 mg/dL (ref 0.3–1.2)
Total Protein: 7.1 g/dL (ref 6.5–8.1)

## 2022-09-17 LAB — TYPE AND SCREEN
ABO/RH(D): O POS
Antibody Screen: NEGATIVE

## 2022-09-17 LAB — PREGNANCY, URINE: Preg Test, Ur: NEGATIVE

## 2022-09-17 LAB — RAPID HIV SCREEN (HIV 1/2 AB+AG)
HIV 1/2 Antibodies: NONREACTIVE
HIV-1 P24 Antigen - HIV24: NONREACTIVE

## 2022-09-19 ENCOUNTER — Other Ambulatory Visit: Payer: Self-pay

## 2022-09-19 ENCOUNTER — Ambulatory Visit (HOSPITAL_BASED_OUTPATIENT_CLINIC_OR_DEPARTMENT_OTHER): Payer: 59 | Admitting: Anesthesiology

## 2022-09-19 ENCOUNTER — Encounter (HOSPITAL_COMMUNITY)
Admission: RE | Disposition: A | Discharge status: Home / Self Care | Payer: Self-pay | Source: Home / Self Care | Attending: Obstetrics & Gynecology

## 2022-09-19 ENCOUNTER — Observation Stay (HOSPITAL_COMMUNITY)
Admission: RE | Admit: 2022-09-19 | Discharge: 2022-09-20 | Disposition: A | Discharge status: Home / Self Care | Payer: 59 | Attending: Obstetrics & Gynecology | Admitting: Obstetrics & Gynecology

## 2022-09-19 ENCOUNTER — Ambulatory Visit (HOSPITAL_COMMUNITY): Payer: 59 | Admitting: Anesthesiology

## 2022-09-19 DIAGNOSIS — R102 Pelvic and perineal pain: Secondary | ICD-10-CM | POA: Diagnosis not present

## 2022-09-19 DIAGNOSIS — F419 Anxiety disorder, unspecified: Secondary | ICD-10-CM | POA: Diagnosis not present

## 2022-09-19 DIAGNOSIS — N920 Excessive and frequent menstruation with regular cycle: Secondary | ICD-10-CM | POA: Insufficient documentation

## 2022-09-19 DIAGNOSIS — D219 Benign neoplasm of connective and other soft tissue, unspecified: Secondary | ICD-10-CM

## 2022-09-19 DIAGNOSIS — D259 Leiomyoma of uterus, unspecified: Secondary | ICD-10-CM | POA: Diagnosis not present

## 2022-09-19 DIAGNOSIS — D649 Anemia, unspecified: Secondary | ICD-10-CM | POA: Diagnosis not present

## 2022-09-19 DIAGNOSIS — Z9889 Other specified postprocedural states: Secondary | ICD-10-CM

## 2022-09-19 DIAGNOSIS — D500 Iron deficiency anemia secondary to blood loss (chronic): Secondary | ICD-10-CM

## 2022-09-19 HISTORY — PX: ROBOTIC ASSISTED LAPAROSCOPIC HYSTERECTOMY AND SALPINGECTOMY: SHX6379

## 2022-09-19 SURGERY — XI ROBOTIC ASSISTED LAPAROSCOPIC HYSTERECTOMY AND SALPINGECTOMY
Anesthesia: General | Site: Abdomen | Laterality: Bilateral

## 2022-09-19 MED ORDER — KETOROLAC TROMETHAMINE 30 MG/ML IJ SOLN
30.0000 mg | Freq: Four times a day (QID) | INTRAMUSCULAR | Status: DC
  Administered 2022-09-19 – 2022-09-20 (×2): 30 mg via INTRAVENOUS
  Filled 2022-09-19 (×2): qty 1

## 2022-09-19 MED ORDER — SCOPOLAMINE 1 MG/3DAYS TD PT72: 1.0000 | MEDICATED_PATCH | Freq: Once | TRANSDERMAL | Status: DC

## 2022-09-19 MED ORDER — LACTATED RINGERS IV SOLN: INTRAVENOUS | Status: DC

## 2022-09-19 MED ORDER — HYDROMORPHONE HCL 1 MG/ML IJ SOLN
0.2500 mg | INTRAMUSCULAR | Status: DC | PRN
Start: 2022-09-19 — End: 2022-09-19

## 2022-09-19 MED ORDER — PROPOFOL 10 MG/ML IV BOLUS
INTRAVENOUS | Status: AC
  Filled 2022-09-19: qty 20

## 2022-09-19 MED ORDER — EPHEDRINE 5 MG/ML INJ
INTRAVENOUS | Status: AC
  Filled 2022-09-19: qty 5

## 2022-09-19 MED ORDER — BISACODYL 10 MG RE SUPP: 10.0000 mg | Freq: Every day | RECTAL | Status: DC | PRN

## 2022-09-19 MED ORDER — LIDOCAINE HCL (CARDIAC) PF 100 MG/5ML IV SOSY
PREFILLED_SYRINGE | INTRAVENOUS | Status: DC | PRN
  Administered 2022-09-19: 80 mg via INTRATRACHEAL

## 2022-09-19 MED ORDER — LEVOFLOXACIN IN D5W 750 MG/150ML IV SOLN
750.0000 mg | Freq: Once | INTRAVENOUS | Status: AC
  Administered 2022-09-19: 750 mg via INTRAVENOUS
  Filled 2022-09-19: qty 150

## 2022-09-19 MED ORDER — IBUPROFEN 600 MG PO TABS: 600.0000 mg | ORAL_TABLET | Freq: Four times a day (QID) | ORAL | Status: DC

## 2022-09-19 MED ORDER — FENTANYL CITRATE PF 50 MCG/ML IJ SOSY: 50.0000 ug | PREFILLED_SYRINGE | INTRAMUSCULAR | Status: DC | PRN

## 2022-09-19 MED ORDER — ENOXAPARIN SODIUM 40 MG/0.4ML IJ SOSY: 40.0000 mg | PREFILLED_SYRINGE | INTRAMUSCULAR | Status: DC

## 2022-09-19 MED ORDER — FENTANYL CITRATE (PF) 100 MCG/2ML IJ SOLN
INTRAMUSCULAR | Status: AC
  Filled 2022-09-19: qty 2

## 2022-09-19 MED ORDER — POVIDONE-IODINE 10 % EX SWAB: 2.0000 | Freq: Once | CUTANEOUS | Status: DC

## 2022-09-19 MED ORDER — DEXAMETHASONE SODIUM PHOSPHATE 10 MG/ML IJ SOLN
INTRAMUSCULAR | Status: AC
  Filled 2022-09-19: qty 1

## 2022-09-19 MED ORDER — ROCURONIUM BROMIDE 10 MG/ML (PF) SYRINGE
PREFILLED_SYRINGE | INTRAVENOUS | Status: DC | PRN
  Administered 2022-09-19: 50 mg via INTRAVENOUS
  Administered 2022-09-19: 10 mg via INTRAVENOUS
  Administered 2022-09-19: 30 mg via INTRAVENOUS
  Administered 2022-09-19 (×2): 10 mg via INTRAVENOUS
  Administered 2022-09-19: 50 mg via INTRAVENOUS
  Administered 2022-09-19: 10 mg via INTRAVENOUS

## 2022-09-19 MED ORDER — CHLORHEXIDINE GLUCONATE 0.12 % MT SOLN
15.0000 mL | Freq: Once | OROMUCOSAL | Status: AC
  Administered 2022-09-19: 15 mL via OROMUCOSAL

## 2022-09-19 MED ORDER — ONDANSETRON HCL 4 MG/2ML IJ SOLN
INTRAMUSCULAR | Status: AC
  Filled 2022-09-19: qty 2

## 2022-09-19 MED ORDER — ROCURONIUM BROMIDE 10 MG/ML (PF) SYRINGE
PREFILLED_SYRINGE | INTRAVENOUS | Status: AC
  Filled 2022-09-19: qty 10

## 2022-09-19 MED ORDER — LIDOCAINE HCL (PF) 2 % IJ SOLN
INTRAMUSCULAR | Status: AC
  Filled 2022-09-19: qty 5

## 2022-09-19 MED ORDER — ONDANSETRON HCL 4 MG/2ML IJ SOLN: 4.0000 mg | Freq: Once | INTRAMUSCULAR | Status: DC | PRN

## 2022-09-19 MED ORDER — SCOPOLAMINE 1 MG/3DAYS TD PT72
MEDICATED_PATCH | TRANSDERMAL | Status: DC | PRN
  Administered 2022-09-19: 1 via TRANSDERMAL

## 2022-09-19 MED ORDER — DEXMEDETOMIDINE HCL IN NACL 80 MCG/20ML IV SOLN
INTRAVENOUS | Status: DC | PRN
  Administered 2022-09-19: 8 ug via INTRAVENOUS
  Administered 2022-09-19 (×2): 4 ug via INTRAVENOUS

## 2022-09-19 MED ORDER — BUPIVACAINE HCL (PF) 0.5 % IJ SOLN
INTRAMUSCULAR | Status: DC | PRN
  Administered 2022-09-19: 40 mL

## 2022-09-19 MED ORDER — ZOLPIDEM TARTRATE 5 MG PO TABS: 5.0000 mg | ORAL_TABLET | Freq: Every evening | ORAL | Status: DC | PRN

## 2022-09-19 MED ORDER — BUPIVACAINE HCL (PF) 0.5 % IJ SOLN
INTRAMUSCULAR | Status: AC
  Filled 2022-09-19: qty 60

## 2022-09-19 MED ORDER — BUPIVACAINE HCL (PF) 0.5 % IJ SOLN
INTRAMUSCULAR | Status: AC
  Filled 2022-09-19: qty 30

## 2022-09-19 MED ORDER — HEMOSTATIC AGENTS (NO CHARGE) OPTIME
TOPICAL | Status: DC | PRN
  Administered 2022-09-19: 1 via TOPICAL

## 2022-09-19 MED ORDER — ORAL CARE MOUTH RINSE: 15.0000 mL | Freq: Once | OROMUCOSAL | Status: AC

## 2022-09-19 MED ORDER — MIDAZOLAM HCL 2 MG/2ML IJ SOLN
INTRAMUSCULAR | Status: DC | PRN
  Administered 2022-09-19: 2 mg via INTRAVENOUS

## 2022-09-19 MED ORDER — CEFAZOLIN SODIUM 1 G IJ SOLR
INTRAMUSCULAR | Status: AC
  Filled 2022-09-19: qty 20

## 2022-09-19 MED ORDER — SUGAMMADEX SODIUM 200 MG/2ML IV SOLN
INTRAVENOUS | Status: DC | PRN
  Administered 2022-09-19: 153.8 mg via INTRAVENOUS

## 2022-09-19 MED ORDER — PHENYLEPHRINE HCL-NACL 20-0.9 MG/250ML-% IV SOLN
INTRAVENOUS | Status: DC | PRN
  Administered 2022-09-19: 20 ug/min via INTRAVENOUS

## 2022-09-19 MED ORDER — PROPOFOL 10 MG/ML IV BOLUS
INTRAVENOUS | Status: DC | PRN
  Administered 2022-09-19: 130 mg via INTRAVENOUS

## 2022-09-19 MED ORDER — HYDROMORPHONE HCL 1 MG/ML IJ SOLN
INTRAMUSCULAR | Status: AC
  Filled 2022-09-19: qty 1

## 2022-09-19 MED ORDER — CEFAZOLIN SODIUM-DEXTROSE 2-4 GM/100ML-% IV SOLN
INTRAVENOUS | Status: AC
  Filled 2022-09-19: qty 100

## 2022-09-19 MED ORDER — ONDANSETRON HCL 4 MG PO TABS: 8.0000 mg | ORAL_TABLET | Freq: Four times a day (QID) | ORAL | Status: DC | PRN

## 2022-09-19 MED ORDER — FENTANYL CITRATE (PF) 100 MCG/2ML IJ SOLN
INTRAMUSCULAR | Status: DC | PRN
  Administered 2022-09-19: 25 ug via INTRAVENOUS
  Administered 2022-09-19: 100 ug via INTRAVENOUS
  Administered 2022-09-19: 50 ug via INTRAVENOUS
  Administered 2022-09-19: 25 ug via INTRAVENOUS
  Administered 2022-09-19 (×2): 50 ug via INTRAVENOUS

## 2022-09-19 MED ORDER — MIDAZOLAM HCL 2 MG/2ML IJ SOLN
INTRAMUSCULAR | Status: AC
  Filled 2022-09-19: qty 2

## 2022-09-19 MED ORDER — ALUM & MAG HYDROXIDE-SIMETH 200-200-20 MG/5ML PO SUSP: 30.0000 mL | ORAL | Status: DC | PRN

## 2022-09-19 MED ORDER — DEXMEDETOMIDINE HCL IN NACL 80 MCG/20ML IV SOLN
INTRAVENOUS | Status: AC
  Filled 2022-09-19: qty 20

## 2022-09-19 MED ORDER — KCL IN DEXTROSE-NACL 20-5-0.45 MEQ/L-%-% IV SOLN: INTRAVENOUS | Status: DC

## 2022-09-19 MED ORDER — ONDANSETRON HCL 4 MG/2ML IJ SOLN
INTRAMUSCULAR | Status: DC | PRN
  Administered 2022-09-19: 4 mg via INTRAVENOUS

## 2022-09-19 MED ORDER — SENNOSIDES-DOCUSATE SODIUM 8.6-50 MG PO TABS: 1.0000 | ORAL_TABLET | Freq: Every evening | ORAL | Status: DC | PRN

## 2022-09-19 MED ORDER — HYDROMORPHONE HCL 1 MG/ML IJ SOLN
INTRAMUSCULAR | Status: DC | PRN
  Administered 2022-09-19 (×2): .5 mg via INTRAVENOUS

## 2022-09-19 MED ORDER — SODIUM CHLORIDE 0.9 % IV SOLN: 8.0000 mg | Freq: Four times a day (QID) | INTRAVENOUS | Status: DC | PRN

## 2022-09-19 MED ORDER — PHENYLEPHRINE 80 MCG/ML (10ML) SYRINGE FOR IV PUSH (FOR BLOOD PRESSURE SUPPORT)
PREFILLED_SYRINGE | INTRAVENOUS | Status: DC | PRN
  Administered 2022-09-19 (×2): 160 ug via INTRAVENOUS
  Administered 2022-09-19: 80 ug via INTRAVENOUS

## 2022-09-19 MED ORDER — CEFAZOLIN SODIUM-DEXTROSE 2-4 GM/100ML-% IV SOLN
2.0000 g | INTRAVENOUS | Status: AC
  Administered 2022-09-19: 2 g via INTRAVENOUS

## 2022-09-19 MED ORDER — CEFAZOLIN SODIUM-DEXTROSE 2-3 GM-%(50ML) IV SOLR
INTRAVENOUS | Status: DC | PRN
  Administered 2022-09-19: 2 g via INTRAVENOUS

## 2022-09-19 MED ORDER — EPHEDRINE SULFATE-NACL 50-0.9 MG/10ML-% IV SOSY
PREFILLED_SYRINGE | INTRAVENOUS | Status: DC | PRN
  Administered 2022-09-19: 5 mg via INTRAVENOUS

## 2022-09-19 MED ORDER — SODIUM CHLORIDE 0.9 % IR SOLN
Status: DC | PRN
  Administered 2022-09-19: 3000 mL

## 2022-09-19 MED ORDER — DEXAMETHASONE SODIUM PHOSPHATE 10 MG/ML IJ SOLN
INTRAMUSCULAR | Status: DC | PRN
  Administered 2022-09-19: 5 mg via INTRAVENOUS

## 2022-09-19 MED ORDER — OXYCODONE HCL 5 MG PO TABS: 5.0000 mg | ORAL_TABLET | ORAL | Status: DC | PRN

## 2022-09-19 MED ORDER — STERILE WATER FOR IRRIGATION IR SOLN
Status: DC | PRN
  Administered 2022-09-19 (×2): 500 mL

## 2022-09-19 MED ORDER — KETOROLAC TROMETHAMINE 30 MG/ML IJ SOLN
30.0000 mg | Freq: Once | INTRAMUSCULAR | Status: AC
  Administered 2022-09-19: 30 mg via INTRAVENOUS
  Filled 2022-09-19: qty 1

## 2022-09-19 MED ORDER — MEPERIDINE HCL 50 MG/ML IJ SOLN: 6.2500 mg | INTRAMUSCULAR | Status: DC | PRN

## 2022-09-19 MED ORDER — SCOPOLAMINE 1 MG/3DAYS TD PT72
MEDICATED_PATCH | TRANSDERMAL | Status: AC
  Administered 2022-09-19: 1.5 mg
  Filled 2022-09-19: qty 1

## 2022-09-19 SURGICAL SUPPLY — 62 items
ADH SKN CLS APL DERMABOND .7 (GAUZE/BANDAGES/DRESSINGS) ×1
APL ESCP 73.6OZ SRGCL (TIP) ×1
BLADE SURG SZ11 CARB STEEL (BLADE) ×1 IMPLANT
CAUTERY HOOK MNPLR 1.6 DVNC XI (INSTRUMENTS) ×1 IMPLANT
COVER LIGHT HANDLE STERIS (MISCELLANEOUS) ×2 IMPLANT
COVER MAYO STAND XLG (MISCELLANEOUS) ×1 IMPLANT
COVER TIP SHEARS 8 DVNC (MISCELLANEOUS) ×1 IMPLANT
DERMABOND ADVANCED .7 DNX12 (GAUZE/BANDAGES/DRESSINGS) ×1 IMPLANT
DRAPE ARM DVNC X/XI (DISPOSABLE) ×4 IMPLANT
DRAPE COLUMN DVNC XI (DISPOSABLE) ×1 IMPLANT
DRIVER NDL MEGA SUTCUT DVNCXI (INSTRUMENTS) ×1 IMPLANT
DRIVER NDLE MEGA SUTCUT DVNCXI (INSTRUMENTS) ×1 IMPLANT
ELECT REM PT RETURN 9FT ADLT (ELECTROSURGICAL) ×1
ELECTRODE REM PT RTRN 9FT ADLT (ELECTROSURGICAL) ×1 IMPLANT
FORCEPS PROGRASP DVNC XI (FORCEP) ×1 IMPLANT
GAUZE 4X4 16PLY ~~LOC~~+RFID DBL (SPONGE) ×2 IMPLANT
GLOVE BIO SURGEON STRL SZ 6.5 (GLOVE) IMPLANT
GLOVE BIOGEL PI IND STRL 7.0 (GLOVE) ×3 IMPLANT
GLOVE BIOGEL PI IND STRL 8 (GLOVE) ×2 IMPLANT
GLOVE ECLIPSE 8.0 STRL XLNG CF (GLOVE) ×3 IMPLANT
GOWN STRL REUS W/TWL LRG LVL3 (GOWN DISPOSABLE) ×2 IMPLANT
GOWN STRL REUS W/TWL XL LVL3 (GOWN DISPOSABLE) ×2 IMPLANT
GYRUS RUMI II 4.0CM BLUE (DISPOSABLE) ×1
IV NS IRRIG 3000ML ARTHROMATIC (IV SOLUTION) IMPLANT
KIT PINK PAD W/HEAD ARE REST (MISCELLANEOUS) ×1
KIT PINK PAD W/HEAD ARM REST (MISCELLANEOUS) ×1 IMPLANT
KIT TURNOVER CYSTO (KITS) ×1 IMPLANT
MANIFOLD NEPTUNE II (INSTRUMENTS) ×1 IMPLANT
NDL HYPO 21X1.5 SAFETY (NEEDLE) ×1 IMPLANT
NDL INSUFFLATION 14GA 120MM (NEEDLE) ×1 IMPLANT
NEEDLE HYPO 21X1.5 SAFETY (NEEDLE) ×1 IMPLANT
NEEDLE INSUFFLATION 14GA 120MM (NEEDLE) ×1 IMPLANT
NS IRRIG 500ML POUR BTL (IV SOLUTION) ×1 IMPLANT
OBTURATOR OPTICAL STND 8 DVNC (TROCAR) ×1
OBTURATOR OPTICALSTD 8 DVNC (TROCAR) ×1 IMPLANT
PACK PERI GYN (CUSTOM PROCEDURE TRAY) ×1 IMPLANT
POWDER SURGICEL 3.0 GRAM (HEMOSTASIS) IMPLANT
RETRACTOR WOUND ALXS 18CM MED (MISCELLANEOUS) IMPLANT
RTRCTR WOUND ALEXIS O 18CM MED (MISCELLANEOUS) ×1
RUMI II GYRUS 4.0CM BLUE (DISPOSABLE) IMPLANT
SCISSORS MNPLR CVD DVNC XI (INSTRUMENTS) IMPLANT
SEAL CANN UNIV 5-8 DVNC XI (MISCELLANEOUS) ×3 IMPLANT
SEALER VESSEL EXT DVNC XI (MISCELLANEOUS) ×1 IMPLANT
SET BASIN LINEN APH (SET/KITS/TRAYS/PACK) ×1 IMPLANT
SET TRI-LUMEN FLTR TB AIRSEAL (TUBING) ×1 IMPLANT
SET TUBE IRRIG SUCTION NO TIP (IRRIGATION / IRRIGATOR) IMPLANT
SOL ANTI FOG 6CC (MISCELLANEOUS) ×1 IMPLANT
SPONGE T-LAP 18X18 ~~LOC~~+RFID (SPONGE) ×1 IMPLANT
SUT STRATAFIX 0 PDS+ CT-2 23 (SUTURE) ×2
SUT VICRYL 0 UR6 27IN ABS (SUTURE) ×1 IMPLANT
SUT VICRYL AB 3-0 FS1 BRD 27IN (SUTURE) ×2 IMPLANT
SUTURE STRATFX 0 PDS+ CT-2 23 (SUTURE) ×1 IMPLANT
SYR 10ML LL (SYRINGE) ×2 IMPLANT
SYR 20ML LL LF (SYRINGE) ×1 IMPLANT
SYR 50ML LL SCALE MARK (SYRINGE) ×2 IMPLANT
SYR CONTROL 10ML LL (SYRINGE) ×2 IMPLANT
TIP ENDOSCOPIC SURGICEL (TIP) IMPLANT
TIP RUMI ORANGE 6.7MMX12CM (TIP) IMPLANT
TRAY FOLEY W/BAG SLVR 16FR (SET/KITS/TRAYS/PACK) ×1
TRAY FOLEY W/BAG SLVR 16FR ST (SET/KITS/TRAYS/PACK) ×1 IMPLANT
TROCAR PORT AIRSEAL 8X120 (TROCAR) ×1 IMPLANT
WATER STERILE IRR 500ML POUR (IV SOLUTION) ×1 IMPLANT

## 2022-09-19 NOTE — Anesthesia Procedure Notes (Addendum)
Procedure Name: Intubation Date/Time: 09/19/2022 1:41 PM  Performed by: Oletha Cruel, CRNAPre-anesthesia Checklist: Patient identified, Emergency Drugs available, Suction available, Patient being monitored and Timeout performed Patient Re-evaluated:Patient Re-evaluated prior to induction Oxygen Delivery Method: Circle system utilized Preoxygenation: Pre-oxygenation with 100% oxygen Induction Type: IV induction Ventilation: Mask ventilation without difficulty Laryngoscope Size: Mac and 4 Grade View: Grade II Tube type: Oral Tube size: 7.0 mm Number of attempts: 1 Airway Equipment and Method: Stylet Placement Confirmation: ETT inserted through vocal cords under direct vision and positive ETCO2 Secured at: 21 cm Tube secured with: Tape Dental Injury: Teeth and Oropharynx as per pre-operative assessment  Comments:  DL, intubation per B. Sheran Fava, CRNA

## 2022-09-19 NOTE — Progress Notes (Signed)
Per OR nurse, Lowella Bandy, patient's family was notified that surgery was taking longer than expected and that Dr. Despina Hidden was progressing and the patient was stable.

## 2022-09-19 NOTE — Transfer of Care (Signed)
Immediate Anesthesia Transfer of Care Note  Patient: Julie Robbins  Procedure(s) Performed: XI ROBOTIC ASSISTED LAPAROSCOPIC HYSTERECTOMY AND SALPINGECTOMY, LEFT OOPHORECTOMY (Bilateral: Abdomen)  Patient Location: General  Anesthesia Type:General  Level of Consciousness: awake, sedated, drowsy, and responds to stimulation  Airway & Oxygen Therapy: Patient Spontanous Breathing and Patient connected to face mask oxygen  Post-op Assessment: Report given to RN and Post -op Vital signs reviewed and stable  Post vital signs: Reviewed and stable  Last Vitals:  Vitals Value Taken Time  BP 111/59 09/19/22  2043  Temp 97.6 09/19/22   2043  Pulse 70 09/19/22 2043  Resp 12 09/19/22 2043  SpO2 100 % 09/19/22 2043  Vitals shown include unvalidated device data.  Last Pain:  Vitals:   09/19/22 1102  TempSrc: Oral  PainSc: 0-No pain      Patients Stated Pain Goal: 5 (09/19/22 1102)  Complications: No notable events documented.

## 2022-09-19 NOTE — Anesthesia Postprocedure Evaluation (Addendum)
Anesthesia Post Note  Patient: Julie Robbins  Procedure(s) Performed: XI ROBOTIC ASSISTED LAPAROSCOPIC HYSTERECTOMY AND SALPINGECTOMY, LEFT OOPHORECTOMY (Bilateral: Abdomen)  Patient location during evaluation: PACU Anesthesia Type: General Level of consciousness: awake and alert, oriented, sedated and patient cooperative Pain management: pain level controlled Vital Signs Assessment: post-procedure vital signs reviewed and stable Respiratory status: spontaneous breathing, nonlabored ventilation, respiratory function stable and patient connected to face mask oxygen Cardiovascular status: blood pressure returned to baseline and stable Postop Assessment: no apparent nausea or vomiting Anesthetic complications: no  No notable events documented.   Last Vitals:  Vitals:   09/19/22 2050 09/19/22 2100  BP:  110/68  Pulse: 78 80  Resp: 14 17  Temp:    SpO2: 100% 100%    Last Pain:  Vitals:   09/19/22 2033  TempSrc:   PainSc: Asleep                 Julie Robbins

## 2022-09-19 NOTE — Op Note (Signed)
Preoperative diagnosis: 24-week size fibroid uterus(from office exam I thought 18 weeks but under anesthesia it was 3 full fingerbreadths above the umbilicus), 1750 cc on sonogram Previous myomectomies x 2 Previous C-sections x 2 Pelvic abdominal pain Menorrhagia Anemia due to menorrhagia, able   Postoperative diagnosis: SAA   Procedure: Xi Robotic hysterectomy with removal of both tubes   Laparoscopic guided placement of TAP block  Surgeon: Lazaro Arms, MD  Assistant: Katha Hamming, DO  Because of the high complexity of this case, the large uterus and the location of the fibroids along with her previous surgical history, was absolutely necessary to require a experienced assistant during this case   Anesthesia: General endotracheal   Findings: Patient was noted to have a large fibroid uterus Was actually bigger when she was under general anesthesia that I could appreciate in the office probably because I was not able to palpate is deeply Exam it was 3 to 4 fingerbreadths above the umbilicus Intraoperatively she had some adhesions of the bladder to the lower uterine segment the biggest problem was the large 9 cm fully calcified broad ligament fibroid in the left as well as the left cornual fibroid on the left The only problem the right side presented was the right ureter was very close to the uterine artery throughout dissect, in fact vessel sealed and ligate the right uterine artery as it came off the internal iliac because of how close it was lower   Description of operation: Patient was taken to the operating room and placed in the low lithotomy position She was prepped and draped in the usual sterile fashion robotic assisted laparoscopic procedure Because of her previous myomectomies and 2 previous C-sections I was being very conservative and getting instruments As a result the first thing I did was put the camera port and above the umbilicus, making an incision dissecting  down to the fascia and placing a Veres needle. I then placed the 8 mm robotic port direct video guidance with 1 pass without difficulty Evaluated the peritoneal cavity at this point and of course incision was lower than the top of the uterus which was the plan but allow me to access the pelvis Evaluation at this point that while it would be quite challenging I was willing to try to proceed with robotic assisted total laparoscopic hysterectomy at this point I then placed the other ports, 3 robotic assisted ports all 8 mm and 1 air seal port 8 mm Peritoneal cavity was insufflated of course during this placement Ports were placed under direct visualization without difficulty The robot was then docked I then left the abdomen and went to put in the Foley catheter the Rumi 2 uterine manipulator I used a 12 cm extender and a large colotomy cup  The vagina was once again prepped additionally   Dr. Charlotta Newton was present and assisting with port placement uterine manipulation The Connye Burkitt was used throughout the procedure along with the Rumi 2 manipulator   The patient had been placed in 24 degrees of Trendelenburg   Instruments used during the robotic hysterectomy: Vessel sealer extender using bipolar energy ProGrasp forceps with no energy Monopolar hook Megacut needle driver 2-0 PDS symmetrical STRATAFIX on a CT 2 needle   I then left the patient and went to the surgical console   The first area that I work done was the bladder There were adhesions of the anterior pelvic wall and the lower uterine segment Bladder peritoneum was actually doubled over the lower  uterine segment These adhesions down using traction of the vessel sealer monopolar hook I developed the bladder flap without any difficulty will be gone the colpotomy cup Because in the how difficult this was in a baby how wanted to be able to do a bottom to top inside out hysterectomy because of the large fibroids and difficulty evaluating the  pelvic anatomy I did an early colpotomy anteriorly from 10:00 to 2:00 with good hemostasis I then found the right round ligament and transected it Transected the right utero-ovarian ligament and took the broad ligament down on the right identifying the right ureter which became very close along the cervix as it along the right pelvic sidewall Did maintain visualization of it throughout the procedure and peristalsis was seen at the end of the case will Interestingly I dissected out the right uterine artery as it came off the internal iliac and transected it well lateral to where it stenosed with the ascending and descending vasculature which was responsible for significant reduction and possible blood loss The left round ligament was very short because there was a large left cornual fibroid and a large left broad ligament fibroid I dissected the peritoneum off the broad ligament fibroid lightly dissecting that out using the monopolar hook the vessel sealer and the ProGrasp for traction At this point I was unable to identify the left ureter but I knew that was not involved with the area that I was operating in That identified is a came across furcation of the iliac vasculature Essentially transected the left round ligament and did not dissection of the entire ligament and pelvic sidewall on the left Was unable to identify the ureter Because of all the redundant broad ligament peritoneum dissected across that freeing up the broad ligament fibroid and was then able to process the uterine vasculature on the left I am again basically did a bottom to top hysterectomy coming off of the anterior colpotomy coagulating the paracervical and uterine vasculature the vessel sealer and cutting with the monopolar hook Then able to elevate the uterus just enough although it was quite difficult because of the size and that a posterior colpotomy performed the same procedure from behind on the right staying medial to the  right ureter coagulating the vasculature with the vessel sealer and then cutting with the monopolar hook I was above the level of the uterosacral ligaments bilaterally and medial to the cardinal ligament Again worked my way up the uterus using the vessel sealer with bipolar and sealing energy all at the side of the uterus I was then able to have enough traction to pull the uterus off the left sidewall enough to get the utero-ovarian ligament however by voice of large I was unable to save the vasculature of the left ovary so the left ovary and tube were both removed Was able to maintain the vasculature of the right ovary and did a right salpingectomy I then cleansed the vagina with the STRATAFIX symmetrical 2-0 PDS usual fashion Making sure incorporated the vaginal angles and the cardinal ligament as well as uterosacral ligaments, with large 1-1/2 cm bites of the vagina Also had to run the redundant peritoneum of the broad ligament because of the broad ligament myoma because it was oozing With good hemostasis after I did pretty much a pursestring stitch of it Ureters were identified and found to be peristalsing normally but quite exposed from the dissection Total active consult time was 220 minutes Care was taken throughout to make sure not  injuring the ureters and bowel and other deep pelvic vasculature as this dissection was quite challenging Biggest challenge was being able to manipulate such a large uterus  Then left the console and went back to the patient The robot was undocked I extended the umbilical incision A small Alexis was placed Extracorporeal morcellation was then performed the uterus was removed in total along with both fallopian tubes and the left ovary I had to use both of viral technique as well as bridges because of the heavy calcification of some of the fibroids I could not do a spiral morcellation Specimen was removed in total and sent to pathology  I then closed the  fascial defect above the umbilicus with 0 Vicryl closing the subcutaneous tissue and given a subcuticular skin closure good hemostasis  I then used the laparoscope undocked of course and evaluated the pelvis there was still a little bit of oozing we suction irrigated several times to make sure that all pedicles were hemostatic and they were but I placed Arista through the laparoscopic port to ensure peritoneal surface hemostasis  Then performed a laparoscopic guided transversus abdominis plane block, using Doyles bubble technique T7 and T10 10 cc of 0.5% Marcaine at 4 different sites 40 cc total The air seal was used to desufflate the peritoneal cavity and all of the ports were removed Subcutaneous and's skin were closed in a subcuticular fashion on the remaining 4 incisions Dermabond was placed      Each incision was dressed   The patient tolerated the procedure well She received 2 g of Ancef and 30 mg of Toradol preoperatively   EBL: 250 cc's of which of course was just backbleeding from the uterus that was being morcellated Overall operative time took me about 45 minutes to get into the peritoneal cavity and placed the uterine manipulator, 220 minutes of active consult time, 45 minutes of extracorporeal morcellation and then 45 minutes of closing the abdominal wall and placing the tap block     Lazaro Arms, MD 09/19/2022 8:24 PM

## 2022-09-19 NOTE — Anesthesia Preprocedure Evaluation (Addendum)
Anesthesia Evaluation  Patient identified by MRN, date of birth, ID band Patient awake    Reviewed: Allergy & Precautions, H&P , NPO status , Patient's Chart, lab work & pertinent test results  History of Anesthesia Complications Negative for: history of anesthetic complications  Airway Mallampati: II  TM Distance: >3 FB Neck ROM: Full    Dental  (+) Dental Advisory Given,    Pulmonary neg pulmonary ROS   Pulmonary exam normal breath sounds clear to auscultation       Cardiovascular negative cardio ROS Normal cardiovascular exam Rhythm:Regular Rate:Normal  17-Sep-2022 14:34:28 [facility]-300 ROUTINE RECORD 1980/07/11 (42 yr) Female Black Vent. rate 80 BPM PR interval 154 ms QRS duration 82 ms QT/QTcB 346/399 ms P-R-T axes 69 75 49 Normal sinus rhythm Normal ECG Confirmed by Steffanie Dunn [id number]) on 09/17/2022 8:56:24 PM   Neuro/Psych  Headaches PSYCHIATRIC DISORDERS Anxiety        GI/Hepatic negative GI ROS, Neg liver ROS,,,  Endo/Other  negative endocrine ROS    Renal/GU negative Renal ROS  negative genitourinary   Musculoskeletal negative musculoskeletal ROS (+)    Abdominal   Peds negative pediatric ROS (+)  Hematology negative hematology ROS (+)   Anesthesia Other Findings Vertigo   Reproductive/Obstetrics negative OB ROS                             Anesthesia Physical Anesthesia Plan  ASA: 2  Anesthesia Plan: General   Post-op Pain Management: Dilaudid IV   Induction: Intravenous  PONV Risk Score and Plan: 4 or greater and Ondansetron, Dexamethasone, Midazolam and Scopolamine patch - Pre-op  Airway Management Planned: Oral ETT  Additional Equipment:   Intra-op Plan:   Post-operative Plan: Extubation in OR  Informed Consent: I have reviewed the patients History and Physical, chart, labs and discussed the procedure including the risks,  benefits and alternatives for the proposed anesthesia with the patient or authorized representative who has indicated his/her understanding and acceptance.     Dental advisory given  Plan Discussed with: CRNA and Surgeon  Anesthesia Plan Comments:         Anesthesia Quick Evaluation

## 2022-09-19 NOTE — H&P (Signed)
Preoperative History and Physical  Julie Robbins is a 42 y.o. [id number] with Patient's last menstrual period was 08/27/2022. admitted for a Xi Robot assist TLH bilateral salpingectomy possible open abdominal hysterectomy.   From 06/28/22 discussion  Julie Robbins and I discussed the issues regarding her fibroids.  This pelvic abdominal pain during her last visit which was her yearly exam She certainly presents a couple of challenges, namely the previous myomectomies x 2 and currently a enlarged fibroid uterus up to 18 weeks size   The possibility of robotic hysterectomy is present but the patient also is aware because of adhesions that it may be unable to be done robotically   We will need to put in a GelPort mini and do an extracorporeal myomectomy in order to remove the fibroids she understands that her previous surgeries including her 2 6 C-sections may preclude this approach   On exam her uterus is mobile and there is room side-to-side which makes me think I can use a fifth arm or a fourth robotic arm to perform uterine manipulation.  She does not have pain with intercourse and if I am able to do the hysterectomy but need to leave the cervix she is comfortable with that approach.    PMH:    Past Medical History:  Diagnosis Date   Medical history non-contributory     PSH:     Past Surgical History:  Procedure Laterality Date   CESAREAN SECTION N/A 02/11/2014   Procedure: CESAREAN SECTION;  Surgeon: Tilda Burrow, MD;  Location: [facility];  Service: Obstetrics;  Laterality: N/A;   CESAREAN SECTION N/A 12/20/2016   Procedure: REPEAT CESAREAN SECTION;  Surgeon: Tilda Burrow, MD;  Location: [facility];  Service: Obstetrics;  Laterality: N/A;   MYOMECTOMY  2006,2011,2014    POb/GynH:      OB History     Gravida  2   Para  2   Term  2   Preterm      AB      Living  2      SAB      IAB      Ectopic      Multiple  0   Live Births  2           SH:    Social History   Tobacco Use   Smoking status: Never   Smokeless tobacco: Never  Vaping Use   Vaping Use: Never used  Substance Use Topics   Alcohol use: Yes    Comment: rarely   Drug use: No    FH:    Family History  Problem Relation Age of Onset   Other Mother        degenerative joint disease   Hypertension Mother    Hypertension Maternal Grandmother    Other Maternal Grandmother        degenerative joint disease   Cancer Paternal Grandfather        multiple myloma   Colon polyps Father      Allergies:  Allergies  Allergen Reactions   Doxycycline Hyclate     Lip swelling    Medications:       Current Facility-Administered Medications:    ceFAZolin (ANCEF) 2-4 GM/100ML-% IVPB, , , ,    ceFAZolin (ANCEF) IVPB 2g/100 mL premix, 2 g, Intravenous, On Call to OR, Lazaro Arms, MD   ketorolac (TORADOL) 30 MG/ML injection 30 mg, 30 mg, Intravenous, Once, Aneya Daddona, Amaryllis Dyke, MD   lactated  ringers infusion, , Intravenous, Continuous, Battula, Rajamani C, MD, Last Rate: 50 mL/hr at 09/19/22 1138, Continued from Pre-op at 09/19/22 1138   povidone-iodine 10 % swab 2 Application, 2 Application, Topical, Once, Shalinda Burkholder, Amaryllis Dyke, MD   scopolamine (TRANSDERM-SCOP) 1 MG/3DAYS, , , ,   Review of Systems:   Review of Systems  Constitutional: Negative for fever, chills, weight loss, malaise/fatigue and diaphoresis.  HENT: Negative for hearing loss, ear pain, nosebleeds, congestion, sore throat, neck pain, tinnitus and ear discharge.   Eyes: Negative for blurred vision, double vision, photophobia, pain, discharge and redness.  Respiratory: Negative for cough, hemoptysis, sputum production, shortness of breath, wheezing and stridor.   Cardiovascular: Negative for chest pain, palpitations, orthopnea, claudication, leg swelling and PND.  Gastrointestinal: Positive for abdominal pain. Negative for heartburn, nausea, vomiting, diarrhea, constipation, blood in stool and melena.   Genitourinary: Negative for dysuria, urgency, frequency, hematuria and flank pain.  Musculoskeletal: Negative for myalgias, back pain, joint pain and falls.  Skin: Negative for itching and rash.  Neurological: Negative for dizziness, tingling, tremors, sensory change, speech change, focal weakness, seizures, loss of consciousness, weakness and headaches.  Endo/Heme/Allergies: Negative for environmental allergies and polydipsia. Does not bruise/bleed easily.  Psychiatric/Behavioral: Negative for depression, suicidal ideas, hallucinations, memory loss and substance abuse. The patient is not nervous/anxious and does not have insomnia.      PHYSICAL EXAM:  Blood pressure 115/77, temperature 98.2 F (36.8 C), temperature source Oral, resp. rate 18, height 5\' 4"  (1.626 m), weight 76.9 kg, last menstrual period 08/27/2022, SpO2 100 %.    Vitals reviewed. Constitutional: She is oriented to person, place, and time. She appears well-developed and well-nourished.  HENT:  Head: Normocephalic and atraumatic.  Right Ear: External ear normal.  Left Ear: External ear normal.  Nose: Nose normal.  Mouth/Throat: Oropharynx is clear and moist.  Eyes: Conjunctivae and EOM are normal. Pupils are equal, round, and reactive to light. Right eye exhibits no discharge. Left eye exhibits no discharge. No scleral icterus.  Neck: Normal range of motion. Neck supple. No tracheal deviation present. No thyromegaly present.  Cardiovascular: Normal rate, regular rhythm, normal heart sounds and intact distal pulses.  Exam reveals no gallop and no friction rub.   No murmur heard. Respiratory: Effort normal and breath sounds normal. No respiratory distress. She has no wheezes. She has no rales. She exhibits no tenderness.  GI: Soft. Bowel sounds are normal. She exhibits no distension uterine fibroids palpable, mobile side to side 18 weeks size. There is tenderness. There is no rebound and no guarding.  Genitourinary:        Vulva is normal without lesions Vagina is pink moist without discharge Cervix normal in appearance and pap is normal Uterus is multiple masses 18 weeks size Adnexa is negative with normal sized ovaries by sonogram  Musculoskeletal: Normal range of motion. She exhibits no edema and no tenderness.  Neurological: She is alert and oriented to person, place, and time. She has normal reflexes. She displays normal reflexes. No cranial nerve deficit. She exhibits normal muscle tone. Coordination normal.  Skin: Skin is warm and dry. No rash noted. No erythema. No pallor.  Psychiatric: She has a normal mood and affect. Her behavior is normal. Judgment and thought content normal.    Labs: Results for orders placed or performed during the hospital encounter of 09/17/22 (from the past 336 hour(s))  CBC   Collection Time: 09/17/22  2:30 PM  Result Value Ref Range   WBC 3.7 (  L) 4.0 - 10.5 K/uL   RBC 3.87 3.87 - 5.11 MIL/uL   Hemoglobin 11.8 (L) 12.0 - 15.0 g/dL   HCT 16.1 (L) 09.6 - 04.5 %   MCV 92.5 80.0 - 100.0 fL   MCH 30.5 26.0 - 34.0 pg   MCHC 33.0 30.0 - 36.0 g/dL   RDW 40.9 81.1 - 91.4 %   Platelets 230 150 - 400 K/uL   nRBC 0.0 0.0 - 0.2 %  Comprehensive metabolic panel   Collection Time: 09/17/22  2:30 PM  Result Value Ref Range   Sodium 136 135 - 145 mmol/L   Potassium 3.6 3.5 - 5.1 mmol/L   Chloride 105 98 - 111 mmol/L   CO2 23 22 - 32 mmol/L   Glucose, Bld 114 (H) 70 - 99 mg/dL   BUN 11 6 - 20 mg/dL   Creatinine, Ser 7.82 0.44 - 1.00 mg/dL   Calcium 8.8 (L) 8.9 - 10.3 mg/dL   Total Protein 7.1 6.5 - 8.1 g/dL   Albumin 4.1 3.5 - 5.0 g/dL   AST 22 15 - 41 U/L   ALT 28 0 - 44 U/L   Alkaline Phosphatase 42 38 - 126 U/L   Total Bilirubin 0.4 0.3 - 1.2 mg/dL   GFR, Estimated >95 >62 mL/min   Anion gap 8 5 - 15  Rapid HIV screen (HIV 1/2 Ab+Ag)   Collection Time: 09/17/22  2:30 PM  Result Value Ref Range   HIV-1 P24 Antigen - HIV24 NON REACTIVE NON REACTIVE   HIV 1/2  Antibodies NON REACTIVE NON REACTIVE   Interpretation (HIV Ag Ab)      A non reactive test result means that HIV 1 or HIV 2 antibodies and HIV 1 p24 antigen were not detected in the specimen.  Urinalysis, Routine w reflex microscopic -Urine, Clean Catch   Collection Time: 09/17/22  2:30 PM  Result Value Ref Range   Color, Urine YELLOW YELLOW   APPearance CLEAR CLEAR   Specific Gravity, Urine 1.014 1.005 - 1.030   pH 6.0 5.0 - 8.0   Glucose, UA NEGATIVE NEGATIVE mg/dL   Hgb urine dipstick NEGATIVE NEGATIVE   Bilirubin Urine NEGATIVE NEGATIVE   Ketones, ur 20 (A) NEGATIVE mg/dL   Protein, ur NEGATIVE NEGATIVE mg/dL   Nitrite NEGATIVE NEGATIVE   Leukocytes,Ua NEGATIVE NEGATIVE  Pregnancy, urine   Collection Time: 09/17/22  2:30 PM  Result Value Ref Range   Preg Test, Ur NEGATIVE NEGATIVE  Type and screen   Collection Time: 09/17/22  2:30 PM  Result Value Ref Range   ABO/RH(D) O POS    Antibody Screen NEG    Sample Expiration 10/01/2022,2359    Extend sample reason      NO TRANSFUSIONS OR PREGNANCY IN THE PAST 3 MONTHS Performed at [facility], [address] [place], [place] [address]     EKG: Orders placed or performed in visit on 09/17/22   EKG 12-Lead   EKG 12-Lead   EKG 12-Lead    Imaging Studies: Narrative & Impression                   ..an Financial trader of Ultrasound Medicine Technical sales engineer) accredited practice   Center for [facility] [address] [place] [address]       Ordering Provider: Lazaro Arms, MD  GYNECOLOGIC SONOGRAM     STACI DOONAN is a 42 y.o. [id number] LMP 08/07/2022 She is here for a pelvic sonogram for menorrhagia,anemia,enlarged uterus.   Uterus                      17 x 12 x 16 cm, Total uterine volume 1723 cc,enlarged heterogeneous anteverted uterus with multiple fibroids,largest fibroids:(#1) lower uterine segment subserosal  fibroid 9.4 x 8.7 x 8.7 cm,(#2) fundal subserosal fibroid 7 x 5.2 x 4.8 cm,(#3) posterior submucosal 4.9 x 3.5 x 4.4 cm   Endometrium          8 mm, symmetrical, distorted from fibroids     Right ovary             2.6 x 1.6 x 2.7 cm, normal   Left ovary                2 x 2.2 x 1.3 cm, normal    No free fluid    Technician Comments:   PELVIC US TA only: enlarged heterogeneous anteverted uterus with multiple fibroids,largest fibroids:(#1) lower uterine segment subserosal fibroid 9.4 x 8.7 x 8.7 cm,(#2) fundal subserosal fibroid 7 x 5.2 x 4.8 cm,(#3) posterior submucosal 4.9 x 3.5 x 4.4 cm,EEC 8 mm,normal ovaries,no free fluid      Karie Chimera 08/17/2022 9:07 AM   Clinical Impression and recommendations:   I have reviewed the sonogram results above, combined with the patient's current clinical course, below are my impressions and any appropriate recommendations for management based on the sonographic findings.   Uterus 1723 gram size total volume(18 weeks clinically), multiple fibroids Endometrium distorted by fibroids, thin 8 mm Ovaries: both ovaries are normal size shape and morphology     Lazaro Arms 08/17/2022 12:29 PM      Assessment: 18 AVWU(9811 gram uterus) fibroid uterus Previous myomectomy x 2 Previous C section x 2 Pelvic abdominal pain Menorrhagia Anemia stable  Plan: Xi Robot assist TLH with BS, understands based on intial laparoscopic findings, abdominal hysterectomy may be our only option  Pt understands the risks of surgery including but not limited t  excessive bleeding requiring transfusion or reoperation, post-operative infection requiring prolonged hospitalization or re-hospitalization and antibiotic therapy, and damage to other organs including bladder, bowel, ureters and major vessels.  The patient also understands the alternative treatment options which were discussed in full.  All questions were answered.  Lazaro Arms 09/19/2022 12:39  PM   Amaryllis Dyke Efrata Brunner 09/19/2022 12:34 PM

## 2022-09-20 ENCOUNTER — Other Ambulatory Visit: Payer: Self-pay

## 2022-09-20 DIAGNOSIS — R102 Pelvic and perineal pain: Secondary | ICD-10-CM

## 2022-09-20 DIAGNOSIS — N920 Excessive and frequent menstruation with regular cycle: Secondary | ICD-10-CM | POA: Diagnosis not present

## 2022-09-20 DIAGNOSIS — D259 Leiomyoma of uterus, unspecified: Secondary | ICD-10-CM

## 2022-09-20 DIAGNOSIS — N83209 Unspecified ovarian cyst, unspecified side: Secondary | ICD-10-CM

## 2022-09-20 DIAGNOSIS — N840 Polyp of corpus uteri: Secondary | ICD-10-CM | POA: Diagnosis not present

## 2022-09-20 LAB — CBC
HCT: 29.4 % — ABNORMAL LOW (ref 36.0–46.0)
Hemoglobin: 9.7 g/dL — ABNORMAL LOW (ref 12.0–15.0)
MCH: 30.7 pg (ref 26.0–34.0)
MCHC: 33 g/dL (ref 30.0–36.0)
MCV: 93 fL (ref 80.0–100.0)
Platelets: 188 10*3/uL (ref 150–400)
RBC: 3.16 MIL/uL — ABNORMAL LOW (ref 3.87–5.11)
RDW: 12.1 % (ref 11.5–15.5)
WBC: 4.8 10*3/uL (ref 4.0–10.5)
nRBC: 0 % (ref 0.0–0.2)

## 2022-09-20 LAB — BASIC METABOLIC PANEL
Anion gap: 6 (ref 5–15)
BUN: 6 mg/dL (ref 6–20)
CO2: 22 mmol/L (ref 22–32)
Calcium: 7.6 mg/dL — ABNORMAL LOW (ref 8.9–10.3)
Chloride: 103 mmol/L (ref 98–111)
Creatinine, Ser: 0.5 mg/dL (ref 0.44–1.00)
GFR, Estimated: >60 mL/min (ref >60)
Glucose, Bld: 161 mg/dL — ABNORMAL HIGH (ref 70–99)
Potassium: 3.8 mmol/L (ref 3.5–5.1)
Sodium: 131 mmol/L — ABNORMAL LOW (ref 135–145)

## 2022-09-20 MED ORDER — ACETAMINOPHEN 500 MG PO TABS
1000.0000 mg | ORAL_TABLET | Freq: Four times a day (QID) | ORAL | Status: DC | PRN
  Administered 2022-09-20: 1000 mg via ORAL
  Filled 2022-09-20: qty 2

## 2022-09-20 MED ORDER — KETOROLAC TROMETHAMINE 10 MG PO TABS: 10.0000 mg | ORAL_TABLET | Freq: Three times a day (TID) | ORAL | 0 refills | Status: AC | PRN

## 2022-09-20 MED ORDER — IBUPROFEN 600 MG PO TABS: 600.0000 mg | ORAL_TABLET | Freq: Four times a day (QID) | ORAL | 0 refills | Status: AC

## 2022-09-20 MED ORDER — OXYCODONE HCL 5 MG PO TABS: 5.0000 mg | ORAL_TABLET | ORAL | 0 refills | Status: AC | PRN

## 2022-09-20 MED ORDER — CIPROFLOXACIN HCL 500 MG PO TABS: 500.0000 mg | ORAL_TABLET | Freq: Two times a day (BID) | ORAL | 0 refills | Status: DC

## 2022-09-20 NOTE — Progress Notes (Signed)
Nsg Discharge Note  Admit Date:  09/19/2022 Discharge date: 09/20/2022   Julie Robbins to be D/C'd Home per MD order.  AVS completed.   Patient/caregiver able to verbalize understanding.  Discharge Medication: Allergies as of 09/20/2022       Reactions   Doxycycline Hyclate    Lip swelling        Medication List     TAKE these medications    Cholecalciferol 50 MCG (2000 UT) Tabs Take 2,000 Units by mouth at bedtime.   ciprofloxacin 500 MG tablet Commonly known as: Cipro Take 1 tablet (500 mg total) by mouth 2 (two) times daily.   ibuprofen 200 MG tablet Commonly known as: ADVIL Take 600 mg by mouth every 8 (eight) hours as needed for moderate pain. What changed: Another medication with the same name was added. Make sure you understand how and when to take each.   ibuprofen 600 MG tablet Commonly known as: ADVIL Take 1 tablet (600 mg total) by mouth every 6 (six) hours. Start taking on: September 21, 2022 What changed: You were already taking a medication with the same name, and this prescription was added. Make sure you understand how and when to take each.   ketorolac 10 MG tablet Commonly known as: TORADOL Take 1 tablet (10 mg total) by mouth every 8 (eight) hours as needed.   oxyCODONE 5 MG immediate release tablet Commonly known as: Oxy IR/ROXICODONE Take 1 tablet (5 mg total) by mouth every 4 (four) hours as needed for moderate pain.   PRENATAL VITAMIN PO Take 1 tablet by mouth at bedtime.        Discharge Assessment: Vitals:   09/20/22 0216 09/20/22 0556  BP: 121/74 112/71  Pulse: 86 90  Resp: 18 18  Temp: 98.7 F (37.1 C) 99.2 F (37.3 C)  SpO2: 100% 100%   Skin clean, dry and intact without evidence of skin break down, no evidence of skin tears noted. IV catheter discontinued intact. Site without signs and symptoms of complications - no redness or edema noted at insertion site, patient denies c/o pain - only slight tenderness at site.  Dressing  with slight pressure applied.  D/c Instructions-Education: Discharge instructions given to patient/family with verbalized understanding. D/c education completed with patient/family including follow up instructions, medication list, d/c activities limitations if indicated, with other d/c instructions as indicated by MD - patient able to verbalize understanding, all questions fully answered. Patient instructed to return to ED, call 911, or call MD for any changes in condition.  Patient escorted via WC, and D/C home via private auto.  Laurena Spies, RN 09/20/2022 12:04 PM

## 2022-09-20 NOTE — TOC CM/SW Note (Signed)
Transition of Care [facility]) - Inpatient Brief Assessment   Patient Details  Name: Julie Robbins MRN: [id number] Date of Birth: [birth year]  Transition of Care [facility] CM/SW Contact:    Karn Cassis, LCSW Phone Number: 09/20/2022, 9:21 AM   Clinical Narrative: Pt reports she lives with husband and children. She is independent with ADLs and has transportation. No needs reported at this time. TOC will follow.     Transition of Care Asessment: Insurance and Status: Insurance coverage has been reviewed Patient has primary care physician: Yes Home environment has been reviewed: lives with husband and children Prior level of function:: independent Prior/Current Home Services: No current home services Social Determinants of Health Reivew: SDOH reviewed no interventions necessary Readmission risk has been reviewed: Yes Transition of care needs: no transition of care needs at this time

## 2022-09-20 NOTE — Discharge Summary (Signed)
Physician Discharge Summary  Patient ID: Julie Robbins MRN: [id number] DOB/AGE: [birth year] 42 y.o.  Admit date: 09/19/2022 Discharge date: 09/20/2022  Admission Diagnoses:S/P RA TLH, LSO, RS + TAP block  Discharge Diagnoses:  Principal Problem:   S/P robot-assisted surgical procedure   Discharged Condition: good  Hospital Course: unremarkable post op course  Consults:   Significant Diagnostic Studies: labs:   Results for orders placed or performed during the hospital encounter of 09/19/22 (from the past 24 hour(s))  CBC     Status: Abnormal   Collection Time: 09/20/22  4:52 AM  Result Value Ref Range   WBC 4.8 4.0 - 10.5 K/uL   RBC 3.16 (L) 3.87 - 5.11 MIL/uL   Hemoglobin 9.7 (L) 12.0 - 15.0 g/dL   HCT 82.9 (L) 56.2 - 13.0 %   MCV 93.0 80.0 - 100.0 fL   MCH 30.7 26.0 - 34.0 pg   MCHC 33.0 30.0 - 36.0 g/dL   RDW 86.5 78.4 - 69.6 %   Platelets 188 150 - 400 K/uL   nRBC 0.0 0.0 - 0.2 %  Basic metabolic panel     Status: Abnormal   Collection Time: 09/20/22  4:52 AM  Result Value Ref Range   Sodium 131 (L) 135 - 145 mmol/L   Potassium 3.8 3.5 - 5.1 mmol/L   Chloride 103 98 - 111 mmol/L   CO2 22 22 - 32 mmol/L   Glucose, Bld 161 (H) 70 - 99 mg/dL   BUN 6 6 - 20 mg/dL   Creatinine, Ser 2.95 0.44 - 1.00 mg/dL   Calcium 7.6 (L) 8.9 - 10.3 mg/dL   GFR, Estimated >28 >41 mL/min   Anion gap 6 5 - 15     Treatments: surgery: as above  Discharge Exam: Blood pressure 112/71, pulse 90, temperature 99.2 F (37.3 C), temperature source Oral, resp. rate 18, height 5\' 4"  (1.626 m), weight 76.9 kg, last menstrual period 08/27/2022, SpO2 100 %. General appearance: alert, cooperative, and no distress GI: soft, non-tender; bowel sounds normal; no masses,  no organomegaly Incision/Wound:all 5 incisions are clean dry intact  Disposition: Discharge disposition: 01-Home or Self Care       Discharge Instructions     Call MD for:  persistant nausea and vomiting   Complete  by: As directed    Call MD for:  severe uncontrolled pain   Complete by: As directed    Call MD for:  temperature >100.4   Complete by: As directed    Diet - low sodium heart healthy   Complete by: As directed    Driving Restrictions   Complete by: As directed    No driving for 72 hours   Increase activity slowly   Complete by: As directed    Lifting restrictions   Complete by: As directed    Do not lift more than 10 pounds for 2 weeks 20 pounds for 6 weeks   No wound care   Complete by: As directed    Sexual Activity Restrictions   Complete by: As directed    No sex 3 months      Allergies as of 09/20/2022       Reactions   Doxycycline Hyclate    Lip swelling        Medication List     TAKE these medications    Cholecalciferol 50 MCG (2000 UT) Tabs Take 2,000 Units by mouth at bedtime.   ciprofloxacin 500 MG tablet Commonly known as: Cipro Take 1  tablet (500 mg total) by mouth 2 (two) times daily.   ibuprofen 200 MG tablet Commonly known as: ADVIL Take 600 mg by mouth every 8 (eight) hours as needed for moderate pain. What changed: Another medication with the same name was added. Make sure you understand how and when to take each.   ibuprofen 600 MG tablet Commonly known as: ADVIL Take 1 tablet (600 mg total) by mouth every 6 (six) hours. Start taking on: September 21, 2022 What changed: You were already taking a medication with the same name, and this prescription was added. Make sure you understand how and when to take each.   ketorolac 10 MG tablet Commonly known as: TORADOL Take 1 tablet (10 mg total) by mouth every 8 (eight) hours as needed.   oxyCODONE 5 MG immediate release tablet Commonly known as: Oxy IR/ROXICODONE Take 1 tablet (5 mg total) by mouth every 4 (four) hours as needed for moderate pain.   PRENATAL VITAMIN PO Take 1 tablet by mouth at bedtime.        Follow-up Information     Lazaro Arms, MD Follow up on 09/28/2022.    Specialties: Obstetrics and Gynecology, Radiology Why: post op visit Contact information: [address] [place] [address] [phone]                 Signed: Lazaro Arms 09/20/2022, 11:56 AM

## 2022-09-21 ENCOUNTER — Encounter (HOSPITAL_COMMUNITY): Payer: Self-pay | Admitting: Obstetrics & Gynecology

## 2022-09-21 ENCOUNTER — Telehealth: Payer: Self-pay

## 2022-09-21 NOTE — Transitions of Care (Post Inpatient/ED Visit) (Signed)
   09/21/2022  Name: Julie Robbins MRN: [id number] DOB: [birth year]  Today's TOC FU Call Status: Today's TOC FU Call Status:: Successful TOC FU Call Competed  Transition Care Management Follow-up Telephone Call [name] Type of Discharge: Inpatient Admission Primary Inpatient Discharge Diagnosis:: Fiboroids Reason for ED Visit: Other: How have you been since you were released from the hospital?: Better Any questions or concerns?: No  Items Reviewed: Did you receive and understand the discharge instructions provided?: Yes Medications obtained,verified, and reconciled?: Yes (Medications Reviewed) Any new allergies since your discharge?: No Dietary orders reviewed?: NA Do you have support at home?: Yes People in Home: spouse, other relative(s)  Medications Reviewed Today: Medications Reviewed Today     Reviewed by Annabell Sabal, CMA (Certified Medical Assistant) on 09/21/22 at 1027  Med List Status: <None>   Medication Order Taking? Sig Documenting Provider Last Dose Status Informant  Cholecalciferol 50 MCG (2000 UT) TABS [id number] Yes Take 2,000 Units by mouth at bedtime. [provider] Taking Active Self  ciprofloxacin (CIPRO) 500 MG tablet [id number] Yes Take 1 tablet (500 mg total) by mouth 2 (two) times daily. Lazaro Arms, MD Taking Active   ibuprofen (ADVIL) 200 MG tablet [id number] Yes Take 600 mg by mouth every 8 (eight) hours as needed for moderate pain. [provider] Taking Active Self  ibuprofen (ADVIL) 600 MG tablet [id number] Yes Take 1 tablet (600 mg total) by mouth every 6 (six) hours. Lazaro Arms, MD Taking Active   ketorolac (TORADOL) 10 MG tablet [id number] Yes Take 1 tablet (10 mg total) by mouth every 8 (eight) hours as needed. Lazaro Arms, MD Taking Active   oxyCODONE (OXY IR/ROXICODONE) 5 MG immediate release tablet [id number] Yes Take 1 tablet (5 mg total) by mouth every 4 (four) hours as needed for  moderate pain. Lazaro Arms, MD Taking Active   Prenatal Vit-Fe Sulfate-FA (PRENATAL VITAMIN PO) [id number] Yes Take 1 tablet by mouth at bedtime.  [provider] Taking Active Self            Home Care and Equipment/Supplies: Were Home Health Services Ordered?: No Any new equipment or medical supplies ordered?: No  Functional Questionnaire: Do you need assistance with bathing/showering or dressing?: No Do you need assistance with meal preparation?: No Do you need assistance with eating?: No Do you have difficulty maintaining continence: No Do you need assistance with getting out of bed/getting out of a chair/moving?: No Do you have difficulty managing or taking your medications?: No  Follow up appointments reviewed: PCP Follow-up appointment confirmed?: No MD Provider Line Number:[phone] Given: Yes Specialist Hospital Follow-up appointment confirmed?: No Reason Specialist Follow-Up Not Confirmed: Patient has Specialist Provider Number and will Call for Appointment Do you need transportation to your follow-up appointment?: No Do you understand care options if your condition(s) worsen?: Yes-patient verbalized understanding    SIGNATURE Fredirick Maudlin

## 2022-09-22 DIAGNOSIS — D500 Iron deficiency anemia secondary to blood loss (chronic): Secondary | ICD-10-CM

## 2022-09-22 DIAGNOSIS — N920 Excessive and frequent menstruation with regular cycle: Secondary | ICD-10-CM

## 2022-09-22 DIAGNOSIS — R102 Pelvic and perineal pain: Secondary | ICD-10-CM

## 2022-09-22 DIAGNOSIS — D219 Benign neoplasm of connective and other soft tissue, unspecified: Secondary | ICD-10-CM

## 2022-09-24 LAB — SURGICAL PATHOLOGY

## 2022-09-28 ENCOUNTER — Ambulatory Visit (INDEPENDENT_AMBULATORY_CARE_PROVIDER_SITE_OTHER): Payer: 59 | Admitting: Obstetrics & Gynecology

## 2022-09-28 ENCOUNTER — Encounter: Payer: Self-pay | Admitting: Obstetrics & Gynecology

## 2022-09-28 VITALS — BP 130/87 | HR 72 | Ht 64.0 in | Wt 165.0 lb

## 2022-09-28 DIAGNOSIS — Z9889 Other specified postprocedural states: Secondary | ICD-10-CM

## 2022-09-28 NOTE — Progress Notes (Unsigned)
  HPI: Patient returns for routine postoperative follow-up having undergone RA TLH LSO RS on 09/19/22.  The patient's immediate postoperative recovery has been unremarkable. Since hospital discharge the patient reports no rpbolems.   Current Outpatient Medications: Cholecalciferol 50 MCG (2000 UT) TABS, Take 2,000 Units by mouth at bedtime. (Patient not taking: Reported on 09/28/2022), Disp: , Rfl:  ibuprofen (ADVIL) 200 MG tablet, Take 600 mg by mouth every 8 (eight) hours as needed for moderate pain. (Patient not taking: Reported on 09/28/2022), Disp: , Rfl:  ibuprofen (ADVIL) 600 MG tablet, Take 1 tablet (600 mg total) by mouth every 6 (six) hours. (Patient not taking: Reported on 09/28/2022), Disp: 30 tablet, Rfl: 0 ketorolac (TORADOL) 10 MG tablet, Take 1 tablet (10 mg total) by mouth every 8 (eight) hours as needed. (Patient not taking: Reported on 09/28/2022), Disp: 15 tablet, Rfl: 0 oxyCODONE (OXY IR/ROXICODONE) 5 MG immediate release tablet, Take 1 tablet (5 mg total) by mouth every 4 (four) hours as needed for moderate pain. (Patient not taking: Reported on 09/28/2022), Disp: 14 tablet, Rfl: 0 Prenatal Vit-Fe Sulfate-FA (PRENATAL VITAMIN PO), Take 1 tablet by mouth at bedtime.  (Patient not taking: Reported on 09/28/2022), Disp: , Rfl:   No current facility-administered medications for this visit.    Blood pressure 130/87, pulse 72, height 5\' 4"  (1.626 m), weight 165 lb (74.8 kg), last menstrual period 08/27/2022.  Physical Exam: 5 incisions healing well Abdomen is benign Cuff intact normal post op  Diagnostic Tests:   Pathology: benign  Impression + Management plan:   ICD-10-CM   1. Post-operative state: RA TLH LSO RS  Z98.890          Medications Prescribed this encounter: No orders of the defined types were placed in this encounter.     Follow up: Return in about 6 weeks (around 11/09/2022) for Post Op, with Dr Despina Hidden.    Lazaro Arms, MD Attending Physician for  the Center for [facility] 09/28/2022 11:48 AM

## 2022-10-24 ENCOUNTER — Encounter: Payer: Self-pay | Admitting: Obstetrics & Gynecology

## 2022-11-09 ENCOUNTER — Ambulatory Visit (INDEPENDENT_AMBULATORY_CARE_PROVIDER_SITE_OTHER): Payer: 59 | Admitting: Obstetrics & Gynecology

## 2022-11-09 ENCOUNTER — Encounter: Payer: Self-pay | Admitting: Obstetrics & Gynecology

## 2022-11-09 VITALS — BP 129/80 | HR 84 | Ht 64.0 in | Wt 165.0 lb

## 2022-11-09 DIAGNOSIS — Z9889 Other specified postprocedural states: Secondary | ICD-10-CM

## 2022-11-09 NOTE — Progress Notes (Signed)
  HPI: Patient returns for routine postoperative follow-up having undergone RA TLH LSO, RS on 09/19/22.  The patient's immediate postoperative recovery has been unremarkable. Since hospital discharge the patient reports area just to the right of midline just below umbilicus(iliohypogastric nerve) with dysthesia, parathesia that has evolved into improving dysthesia.   Current Outpatient Medications: Cholecalciferol 50 MCG (2000 UT) TABS, Take 2,000 Units by mouth at bedtime., Disp: , Rfl:  ibuprofen (ADVIL) 200 MG tablet, Take 600 mg by mouth every 8 (eight) hours as needed for moderate pain. (Patient not taking: Reported on 09/28/2022), Disp: , Rfl:  ibuprofen (ADVIL) 600 MG tablet, Take 1 tablet (600 mg total) by mouth every 6 (six) hours. (Patient not taking: Reported on 09/28/2022), Disp: 30 tablet, Rfl: 0 ketorolac (TORADOL) 10 MG tablet, Take 1 tablet (10 mg total) by mouth every 8 (eight) hours as needed. (Patient not taking: Reported on 09/28/2022), Disp: 15 tablet, Rfl: 0 oxyCODONE (OXY IR/ROXICODONE) 5 MG immediate release tablet, Take 1 tablet (5 mg total) by mouth every 4 (four) hours as needed for moderate pain. (Patient not taking: Reported on 09/28/2022), Disp: 14 tablet, Rfl: 0 Prenatal Vit-Fe Sulfate-FA (PRENATAL VITAMIN PO), Take 1 tablet by mouth at bedtime.  (Patient not taking: Reported on 09/28/2022), Disp: , Rfl:   No current facility-administered medications for this visit.    Blood pressure 129/80, pulse 84, height 5\' 4"  (1.626 m), weight 165 lb (74.8 kg), last menstrual period 08/27/2022.  Physical Exam: 5 incisions all well healed Abdomen soft benign Exam normal in distribution of iliohypogastric nerve T11-12 Cuff well healed, suture present (180 day PDS) no discharge  Diagnostic Tests:   Pathology: benign  Impression + Management plan:   ICD-10-CM   1. Post-operative state: RA TLH LSO RS  Z98.890    Delat intercourse for 4 more weeks and then cautiously, follow  up prn or ?3 years         Medications Prescribed this encounter: No orders of the defined types were placed in this encounter.     Follow up: Return if symptoms worsen or fail to improve.    Lazaro Arms, MD Attending Physician for the Center for [facility] 11/09/2022 9:39 AM

## 2023-01-15 ENCOUNTER — Other Ambulatory Visit (HOSPITAL_BASED_OUTPATIENT_CLINIC_OR_DEPARTMENT_OTHER): Payer: Self-pay | Admitting: Internal Medicine

## 2023-01-15 DIAGNOSIS — Z1231 Encounter for screening mammogram for malignant neoplasm of breast: Secondary | ICD-10-CM

## 2023-01-21 ENCOUNTER — Ambulatory Visit (HOSPITAL_BASED_OUTPATIENT_CLINIC_OR_DEPARTMENT_OTHER)
Admission: RE | Admit: 2023-01-21 | Discharge: 2023-01-21 | Disposition: A | Discharge status: Home / Self Care | Payer: 59 | Source: Ambulatory Visit | Attending: Internal Medicine | Admitting: Internal Medicine

## 2023-01-21 ENCOUNTER — Encounter (HOSPITAL_BASED_OUTPATIENT_CLINIC_OR_DEPARTMENT_OTHER): Payer: Self-pay

## 2023-01-21 DIAGNOSIS — Z1231 Encounter for screening mammogram for malignant neoplasm of breast: Secondary | ICD-10-CM | POA: Insufficient documentation

## 2023-07-25 ENCOUNTER — Ambulatory Visit (INDEPENDENT_AMBULATORY_CARE_PROVIDER_SITE_OTHER): Payer: 59 | Admitting: Internal Medicine

## 2023-07-25 ENCOUNTER — Encounter: Payer: Self-pay | Admitting: Internal Medicine

## 2023-07-25 VITALS — BP 120/78 | Temp 97.8°F | Ht 64.0 in | Wt 164.8 lb

## 2023-07-25 DIAGNOSIS — Z862 Personal history of diseases of the blood and blood-forming organs and certain disorders involving the immune mechanism: Secondary | ICD-10-CM | POA: Diagnosis not present

## 2023-07-25 DIAGNOSIS — E559 Vitamin D deficiency, unspecified: Secondary | ICD-10-CM | POA: Diagnosis not present

## 2023-07-25 DIAGNOSIS — Z0000 Encounter for general adult medical examination without abnormal findings: Secondary | ICD-10-CM | POA: Diagnosis not present

## 2023-07-25 DIAGNOSIS — R7309 Other abnormal glucose: Secondary | ICD-10-CM | POA: Diagnosis not present

## 2023-07-25 NOTE — Assessment & Plan Note (Signed)

## 2023-07-25 NOTE — Progress Notes (Signed)
 I,Victoria T Deloria Lair, CMA,acting as a Neurosurgeon for Gwynneth Aliment, MD.,have documented all relevant documentation on the behalf of Gwynneth Aliment, MD,as directed by  Gwynneth Aliment, MD while in the presence of Gwynneth Aliment, MD.  Subjective:    Patient ID: Julie Robbins , female    DOB: June 18, 1980 , 43 y.o.   MRN: [id number]  Chief Complaint  Patient presents with   Annual Exam    Patient presents today for annual exam. She has no specific questions or concerns.  Gyn: Duane Lope.     HPI Discussed the use of AI scribe software for clinical note transcription with the patient, who gave verbal consent to proceed.  History of Present Illness Julie Robbins is a 43 year old female who presents for her annual physical exam.    She underwent a hysterectomy last year and has a history of two C-sections and fibroid surgeries. She retains her right ovary as the left was removed during the hysterectomy. She feels better and is not currently taking any iron supplements.  She is not experiencing any post-operative issues and her bowels are functioning well, with regular daily movements, although she occasionally misses a day.  She is not currently engaging in regular exercise. She manages stress through self-care practices, prayer, and monthly therapy sessions with a therapist named Mila Palmer, who operates a [employer].     Past Medical History:  Diagnosis Date   Medical history non-contributory      Family History  Problem Relation Age of Onset   Other Mother        degenerative joint disease   Hypertension Mother    Hypertension Maternal Grandmother    Other Maternal Grandmother        degenerative joint disease   Cancer Paternal Grandfather        multiple myloma   Colon polyps Father      Current Outpatient Medications:    Cholecalciferol 50 MCG (2000 UT) TABS, Take 2,000 Units by mouth at bedtime., Disp: , Rfl:    ibuprofen (ADVIL) 200 MG tablet,  Take 600 mg by mouth every 8 (eight) hours as needed for moderate pain. (Patient not taking: Reported on 09/28/2022), Disp: , Rfl:    ibuprofen (ADVIL) 600 MG tablet, Take 1 tablet (600 mg total) by mouth every 6 (six) hours. (Patient not taking: Reported on 07/25/2023), Disp: 30 tablet, Rfl: 0   ketorolac (TORADOL) 10 MG tablet, Take 1 tablet (10 mg total) by mouth every 8 (eight) hours as needed. (Patient not taking: Reported on 07/25/2023), Disp: 15 tablet, Rfl: 0   oxyCODONE (OXY IR/ROXICODONE) 5 MG immediate release tablet, Take 1 tablet (5 mg total) by mouth every 4 (four) hours as needed for moderate pain. (Patient not taking: Reported on 07/25/2023), Disp: 14 tablet, Rfl: 0   Prenatal Vit-Fe Sulfate-FA (PRENATAL VITAMIN PO), Take 1 tablet by mouth at bedtime.  (Patient not taking: Reported on 09/28/2022), Disp: , Rfl:    Allergies  Allergen Reactions   Doxycycline Hyclate     Lip swelling      The patient states she uses status post hysterectomy for birth control. Patient's last menstrual period was 08/27/2022.. Negative for Dysmenorrhea. Negative for: breast discharge, breast lump(s), breast pain and breast self exam. Associated symptoms include abnormal vaginal bleeding. Pertinent negatives include abnormal bleeding (hematology), anxiety, decreased libido, depression, difficulty falling sleep, dyspareunia, history of infertility, nocturia, sexual dysfunction, sleep disturbances, urinary incontinence, urinary urgency, vaginal discharge and  vaginal itching. Diet regular.The patient states her exercise level is  intermittent.  . The patient's tobacco use is:  Social History   Tobacco Use  Smoking Status Never  Smokeless Tobacco Never  . She has been exposed to passive smoke. The patient's alcohol use is:  Social History   Substance and Sexual Activity  Alcohol Use Yes   Comment: rarely    Review of Systems  Constitutional: Negative.   HENT: Negative.    Eyes: Negative.    Respiratory: Negative.    Cardiovascular: Negative.   Gastrointestinal: Negative.   Endocrine: Negative.   Genitourinary: Negative.   Musculoskeletal: Negative.   Skin: Negative.   Allergic/Immunologic: Negative.   Neurological: Negative.   Hematological: Negative.   Psychiatric/Behavioral: Negative.       Today's Vitals   07/25/23 0905  BP: 120/78  Temp: 97.8 F (36.6 C)  SpO2: 98%  Weight: 164 lb 12.8 oz (74.8 kg)  Height: 5\' 4"  (1.626 m)   Body mass index is 28.29 kg/m.  Wt Readings from Last 3 Encounters:  07/25/23 164 lb 12.8 oz (74.8 kg)  11/09/22 165 lb (74.8 kg)  09/28/22 165 lb (74.8 kg)     Objective:  Physical Exam Vitals and nursing note reviewed.  Constitutional:      Appearance: Normal appearance.  HENT:     Head: Normocephalic and atraumatic.     Right Ear: Tympanic membrane, ear canal and external ear normal.     Left Ear: Tympanic membrane, ear canal and external ear normal.     Nose: Nose normal.     Mouth/Throat:     Mouth: Mucous membranes are moist.     Pharynx: Oropharynx is clear.  Eyes:     Extraocular Movements: Extraocular movements intact.     Conjunctiva/sclera: Conjunctivae normal.     Pupils: Pupils are equal, round, and reactive to light.  Cardiovascular:     Rate and Rhythm: Normal rate and regular rhythm.     Pulses: Normal pulses.     Heart sounds: Normal heart sounds.  Pulmonary:     Effort: Pulmonary effort is normal.     Breath sounds: Normal breath sounds.  Abdominal:     General: Abdomen is flat. Bowel sounds are normal.     Palpations: Abdomen is soft.  Genitourinary:    Comments: deferred Musculoskeletal:        General: Normal range of motion.     Cervical back: Normal range of motion and neck supple.  Skin:    General: Skin is warm and dry.  Neurological:     General: No focal deficit present.     Mental Status: She is alert and oriented to person, place, and time.  Psychiatric:        Mood and Affect:  Mood normal.        Behavior: Behavior normal.         Assessment And Plan:     Encounter for general adult medical examination w/o abnormal findings Assessment & Plan: A full exam was performed.  Importance of monthly self breast exams was discussed with the patient.  She is advised to get 30-45 minutes of regular exercise, no less than four to five days per week. Both weight-bearing and aerobic exercises are recommended.  She is advised to follow a healthy diet with at least six fruits/veggies per day, decrease intake of red meat and other saturated fats and to increase fish intake to twice weekly.  Meats/fish should not be  fried -- baked, boiled or broiled is preferable. It is also important to cut back on your sugar intake.  Be sure to read labels - try to avoid anything with added sugar, high fructose corn syrup or other sweeteners.  If you must use a sweetener, you can try stevia or monkfruit.  It is also important to avoid artificially sweetened foods/beverages and diet drinks. Lastly, wear SPF 50 sunscreen on exposed skin and when in direct sunlight for an extended period of time.  Be sure to avoid fast food restaurants and aim for at least 60 ounces of water daily.      Orders: -     CBC -     CMP14+EGFR -     Lipid panel -     TSH -     Hemoglobin A1c -     VITAMIN D 25 Hydroxy (Vit-D Deficiency, Fractures)     Return for 1 year HM . Patient was given opportunity to ask questions. Patient verbalized understanding of the plan and was able to repeat key elements of the plan. All questions were answered to their satisfaction.   I, Smiley Dung, MD, have reviewed all documentation for this visit. The documentation on 07/25/23 for the exam, diagnosis, procedures, and orders are all accurate and complete.

## 2023-07-25 NOTE — Patient Instructions (Signed)

## 2023-07-26 LAB — LIPID PANEL
Chol/HDL Ratio: 3.1 ratio (ref 0.0–4.4)
Cholesterol, Total: 154 mg/dL (ref 100–199)
HDL: 50 mg/dL (ref >39)
LDL Chol Calc (NIH): 93 mg/dL (ref 0–99)
Triglycerides: 52 mg/dL (ref 0–149)
VLDL Cholesterol Cal: 11 mg/dL (ref 5–40)

## 2023-07-26 LAB — CMP14+EGFR
ALT: 11 IU/L (ref 0–32)
AST: 21 IU/L (ref 0–40)
Albumin: 4.9 g/dL (ref 3.9–4.9)
Alkaline Phosphatase: 61 IU/L (ref 44–121)
BUN/Creatinine Ratio: 26 — ABNORMAL HIGH (ref 9–23)
BUN: 15 mg/dL (ref 6–24)
Bilirubin Total: 0.4 mg/dL (ref 0.0–1.2)
CO2: 20 mmol/L (ref 20–29)
Calcium: 9.3 mg/dL (ref 8.7–10.2)
Chloride: 103 mmol/L (ref 96–106)
Creatinine, Ser: 0.58 mg/dL (ref 0.57–1.00)
Globulin, Total: 2.9 g/dL (ref 1.5–4.5)
Glucose: 89 mg/dL (ref 70–99)
Potassium: 4.1 mmol/L (ref 3.5–5.2)
Sodium: 139 mmol/L (ref 134–144)
Total Protein: 7.8 g/dL (ref 6.0–8.5)
eGFR: 116 mL/min/{1.73_m2} (ref >59)

## 2023-07-26 LAB — CBC
Hematocrit: 40.3 % (ref 34.0–46.6)
Hemoglobin: 13.3 g/dL (ref 11.1–15.9)
MCH: 31.1 pg (ref 26.6–33.0)
MCHC: 33 g/dL (ref 31.5–35.7)
MCV: 94 fL (ref 79–97)
Platelets: 245 10*3/uL (ref 150–450)
RBC: 4.28 x10E6/uL (ref 3.77–5.28)
RDW: 12.7 % (ref 11.7–15.4)
WBC: 3.5 10*3/uL (ref 3.4–10.8)

## 2023-07-26 LAB — VITAMIN D 25 HYDROXY (VIT D DEFICIENCY, FRACTURES): Vit D, 25-Hydroxy: 32.4 ng/mL (ref 30.0–100.0)

## 2023-07-26 LAB — TSH: TSH: 1.6 u[IU]/mL (ref 0.450–4.500)

## 2023-07-26 LAB — HEMOGLOBIN A1C
Est. average glucose Bld gHb Est-mCnc: 123 mg/dL
Hgb A1c MFr Bld: 5.9 % — ABNORMAL HIGH (ref 4.8–5.6)

## 2023-07-31 ENCOUNTER — Encounter: Payer: Self-pay | Admitting: Internal Medicine

## 2023-10-17 DIAGNOSIS — F419 Anxiety disorder, unspecified: Secondary | ICD-10-CM | POA: Diagnosis not present

## 2024-01-13 ENCOUNTER — Other Ambulatory Visit (HOSPITAL_BASED_OUTPATIENT_CLINIC_OR_DEPARTMENT_OTHER): Payer: Self-pay | Admitting: Internal Medicine

## 2024-01-13 DIAGNOSIS — Z1231 Encounter for screening mammogram for malignant neoplasm of breast: Secondary | ICD-10-CM

## 2024-01-30 ENCOUNTER — Encounter (HOSPITAL_BASED_OUTPATIENT_CLINIC_OR_DEPARTMENT_OTHER): Payer: Self-pay

## 2024-01-30 ENCOUNTER — Ambulatory Visit (HOSPITAL_BASED_OUTPATIENT_CLINIC_OR_DEPARTMENT_OTHER)
Admission: RE | Admit: 2024-01-30 | Discharge: 2024-01-30 | Disposition: A | Discharge status: Home / Self Care | Payer: Self-pay | Source: Ambulatory Visit | Attending: Internal Medicine | Admitting: Internal Medicine

## 2024-01-30 DIAGNOSIS — Z1231 Encounter for screening mammogram for malignant neoplasm of breast: Secondary | ICD-10-CM | POA: Insufficient documentation

## 2024-07-30 ENCOUNTER — Encounter: Payer: Self-pay | Admitting: Internal Medicine
